# Patient Record
Sex: Female | Born: 1960 | Race: White | Hispanic: No | Marital: Married | Smoking: Never smoker
Health system: Southern US, Community
[De-identification: ages and names within clinical notes are randomized; demographics above are authoritative.]

## PROBLEM LIST (undated history)

## (undated) DIAGNOSIS — Z8742 Personal history of other diseases of the female genital tract: Secondary | ICD-10-CM

## (undated) DIAGNOSIS — E7800 Pure hypercholesterolemia, unspecified: Secondary | ICD-10-CM

## (undated) HISTORY — DX: Personal history of other diseases of the female genital tract: Z87.42

## (undated) HISTORY — DX: Pure hypercholesterolemia, unspecified: E78.00

---

## 1993-08-14 HISTORY — PX: PELVIC LAPAROSCOPY: SHX162

## 2000-02-21 ENCOUNTER — Other Ambulatory Visit: Admission: RE | Admit: 2000-02-21 | Discharge: 2000-02-21 | Payer: Self-pay | Admitting: Obstetrics and Gynecology

## 2001-02-21 ENCOUNTER — Other Ambulatory Visit: Admission: RE | Admit: 2001-02-21 | Discharge: 2001-02-21 | Payer: Self-pay | Admitting: Obstetrics and Gynecology

## 2002-06-04 ENCOUNTER — Encounter: Payer: Self-pay | Admitting: Obstetrics and Gynecology

## 2002-06-04 ENCOUNTER — Ambulatory Visit (HOSPITAL_COMMUNITY): Admission: RE | Admit: 2002-06-04 | Discharge: 2002-06-04 | Payer: Self-pay | Admitting: Obstetrics and Gynecology

## 2003-03-19 ENCOUNTER — Other Ambulatory Visit: Admission: RE | Admit: 2003-03-19 | Discharge: 2003-03-19 | Payer: Self-pay | Admitting: Obstetrics and Gynecology

## 2003-06-10 ENCOUNTER — Ambulatory Visit (HOSPITAL_COMMUNITY): Admission: RE | Admit: 2003-06-10 | Discharge: 2003-06-10 | Payer: Self-pay | Admitting: Obstetrics and Gynecology

## 2004-03-23 ENCOUNTER — Other Ambulatory Visit: Admission: RE | Admit: 2004-03-23 | Discharge: 2004-03-23 | Payer: Self-pay | Admitting: Obstetrics and Gynecology

## 2004-06-10 ENCOUNTER — Ambulatory Visit (HOSPITAL_COMMUNITY): Admission: RE | Admit: 2004-06-10 | Discharge: 2004-06-10 | Payer: Self-pay | Admitting: Obstetrics and Gynecology

## 2005-03-24 ENCOUNTER — Other Ambulatory Visit: Admission: RE | Admit: 2005-03-24 | Discharge: 2005-03-24 | Payer: Self-pay | Admitting: Obstetrics and Gynecology

## 2005-06-12 ENCOUNTER — Ambulatory Visit (HOSPITAL_COMMUNITY): Admission: RE | Admit: 2005-06-12 | Discharge: 2005-06-12 | Payer: Self-pay | Admitting: Obstetrics and Gynecology

## 2006-04-12 ENCOUNTER — Other Ambulatory Visit: Admission: RE | Admit: 2006-04-12 | Discharge: 2006-04-12 | Payer: Self-pay | Admitting: Obstetrics and Gynecology

## 2006-06-13 ENCOUNTER — Ambulatory Visit (HOSPITAL_COMMUNITY): Admission: RE | Admit: 2006-06-13 | Discharge: 2006-06-13 | Payer: Self-pay | Admitting: Obstetrics and Gynecology

## 2006-06-13 IMAGING — MG MM DIGITAL SCREENING BILAT
4 series · 4 of 4 positions shown · non-contrast
Comparison: none

DG SCREEN MAMMOGRAM BILATERAL
Bilateral CC and MLO view(s) were taken.

DIGITAL SCREENING MAMMOGRAM WITH CAD:
There is a fibroglandular pattern.  No masses or malignant type calcifications are identified.  
Compared with prior studies.

[R CC]
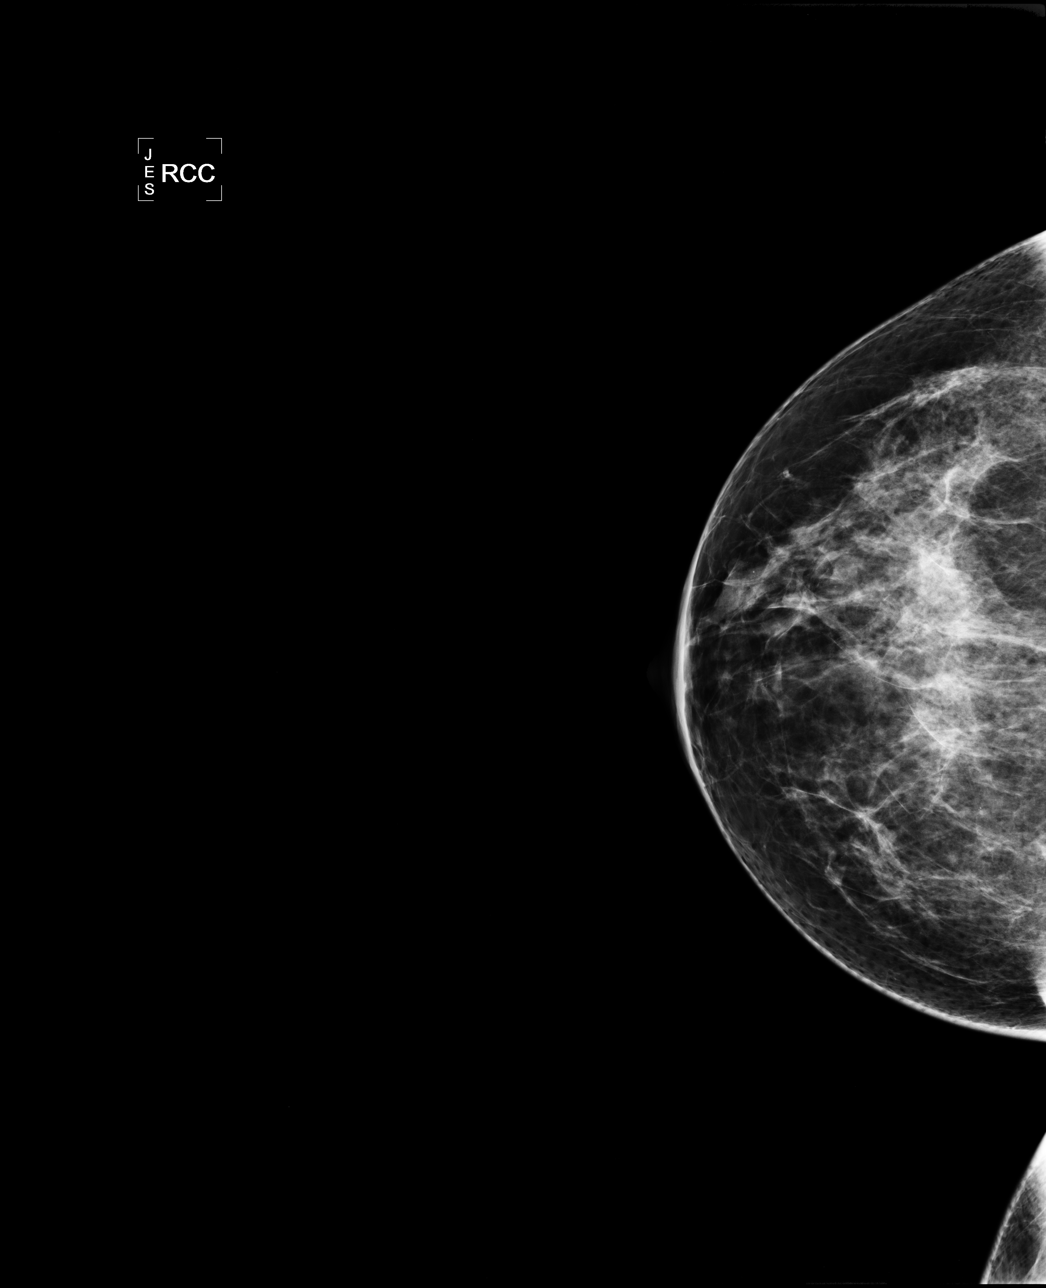

[R MLO]
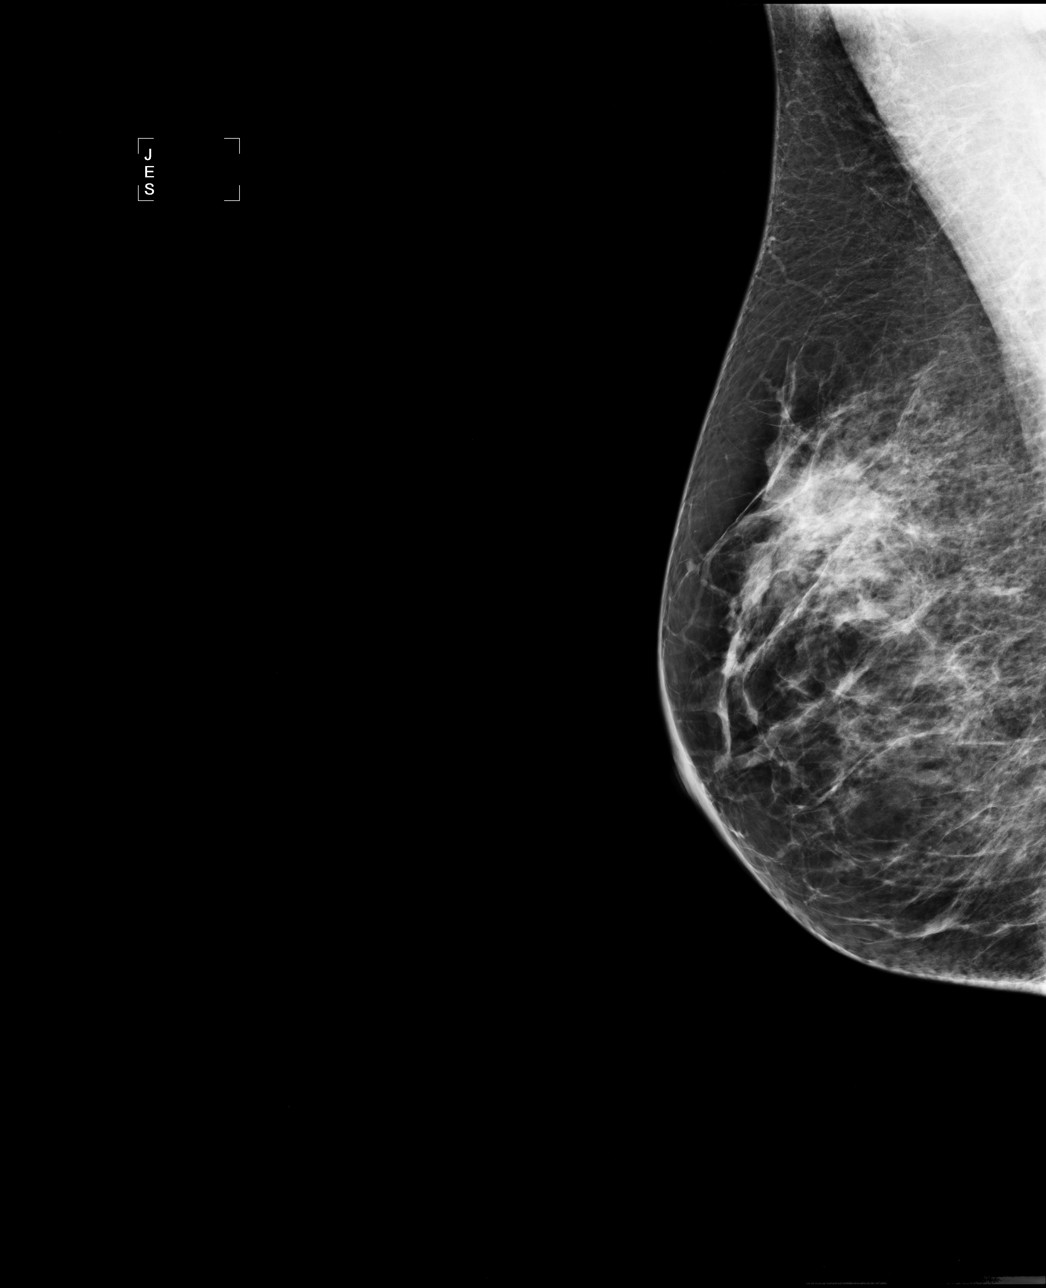

[L CC]
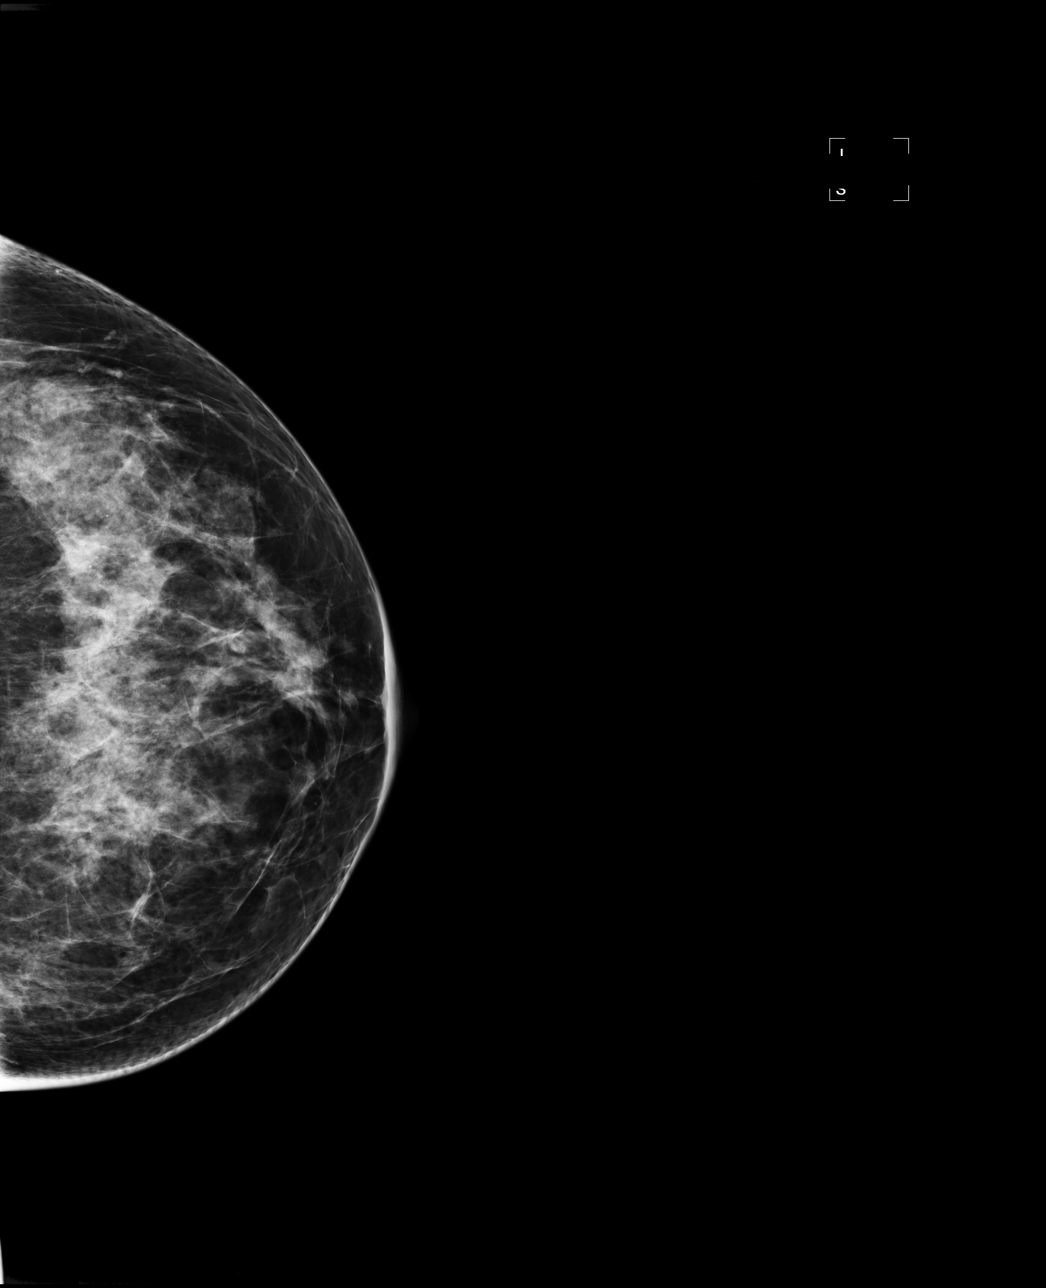

[L MLO]
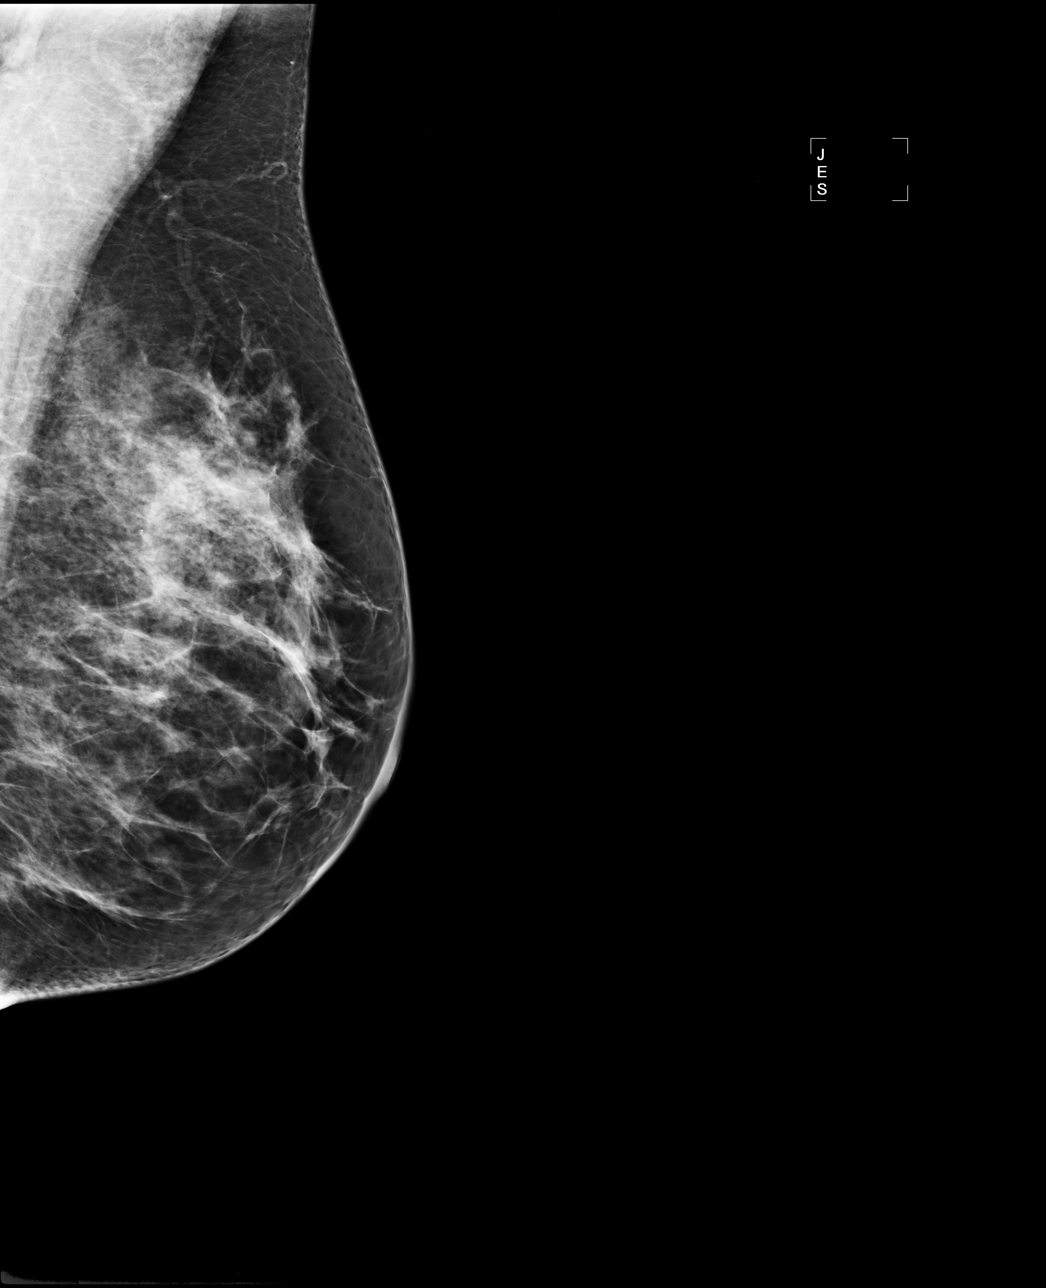

[4 of 4 positions shown; findings below may reference images not displayed]

IMPRESSION: No specific mammographic evidence of malignancy.  Next screening mammogram is recommended in one 
year.

ASSESSMENT: Negative - BI-RADS 1

Screening mammogram in 1 year.
ANALYZED BY COMPUTER AIDED DETECTION. , THIS PROCEDURE WAS A DIGITAL MAMMOGRAM.

## 2007-04-22 ENCOUNTER — Other Ambulatory Visit: Admission: RE | Admit: 2007-04-22 | Discharge: 2007-04-22 | Payer: Self-pay | Admitting: Obstetrics and Gynecology

## 2007-06-17 ENCOUNTER — Ambulatory Visit (HOSPITAL_COMMUNITY): Admission: RE | Admit: 2007-06-17 | Discharge: 2007-06-17 | Payer: Self-pay | Admitting: Obstetrics and Gynecology

## 2007-06-17 IMAGING — MG MM DIGITAL SCREENING BILAT
4 series · 4 of 4 positions shown · non-contrast
Comparison: Prior studies.

DG SCREEN MAMMOGRAM BILATERAL
Bilateral CC and MLO view(s) were taken.
Prior study comparison: [DATE], bilateral screening mammogram.

DIGITAL SCREENING MAMMOGRAM WITH CAD:

[R CC]
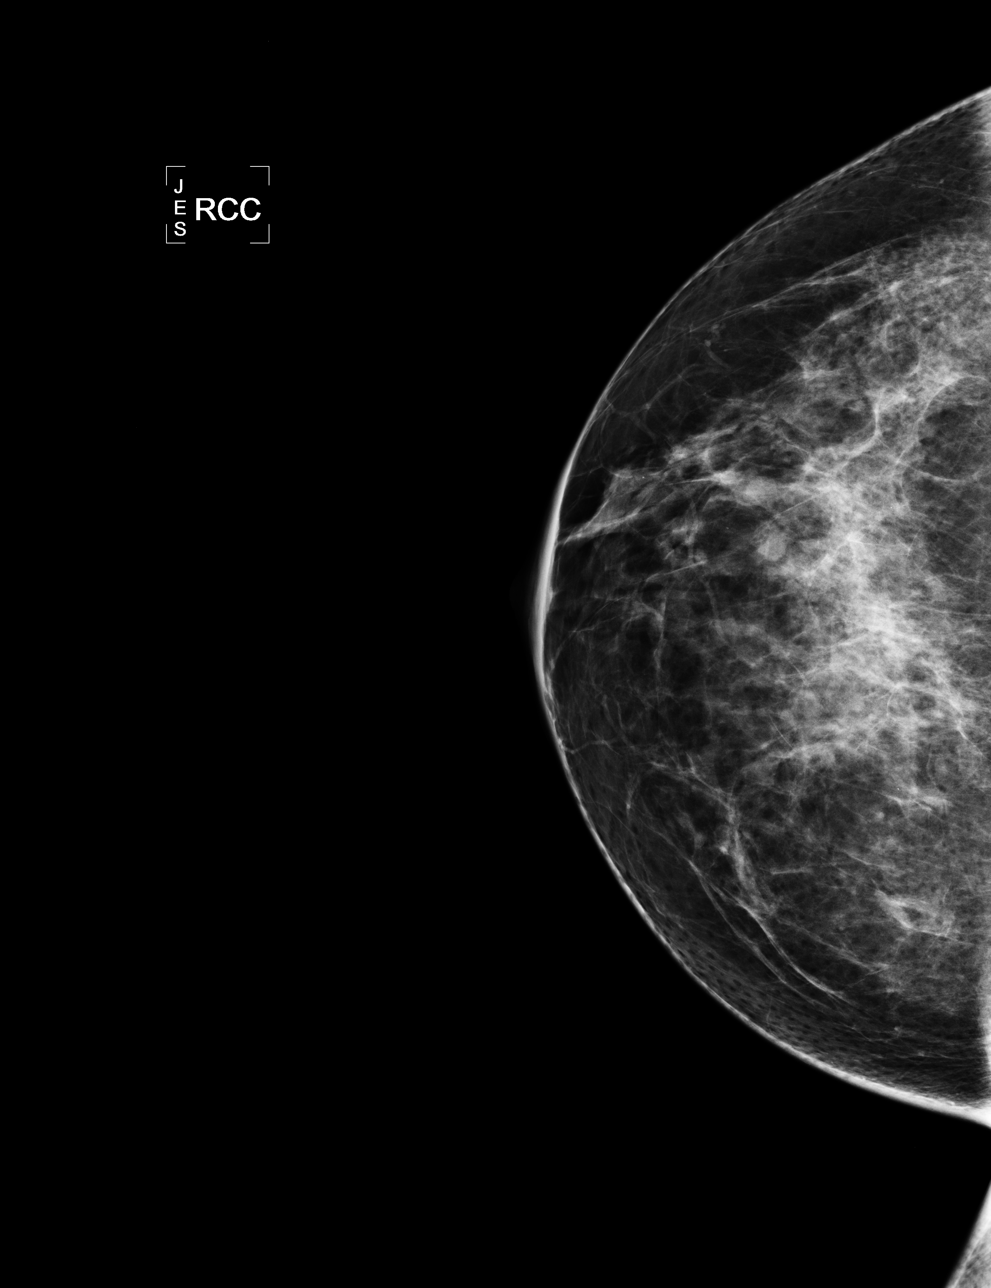

[R MLO]
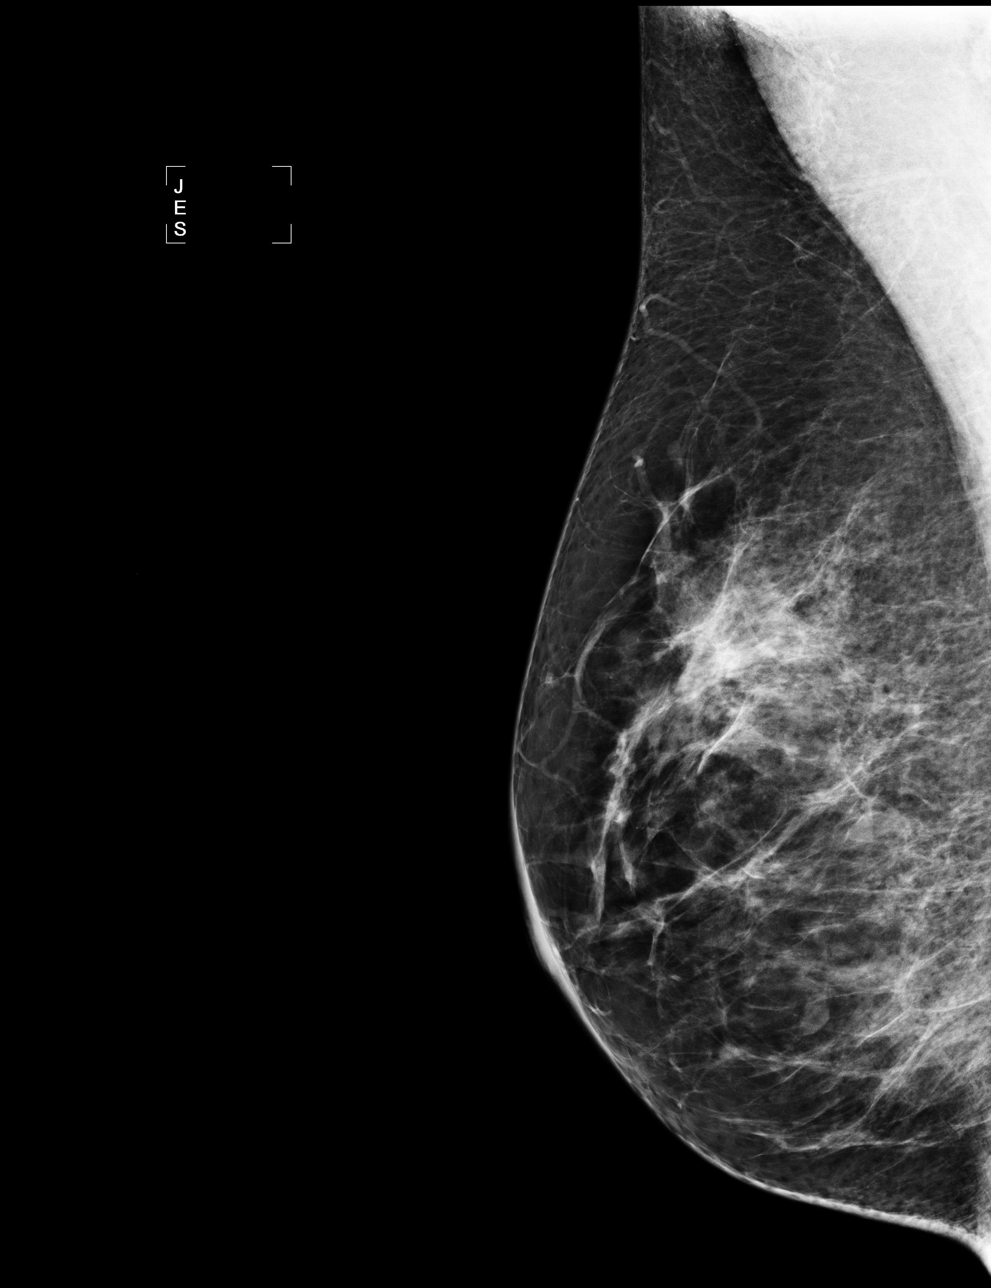

[L CC]
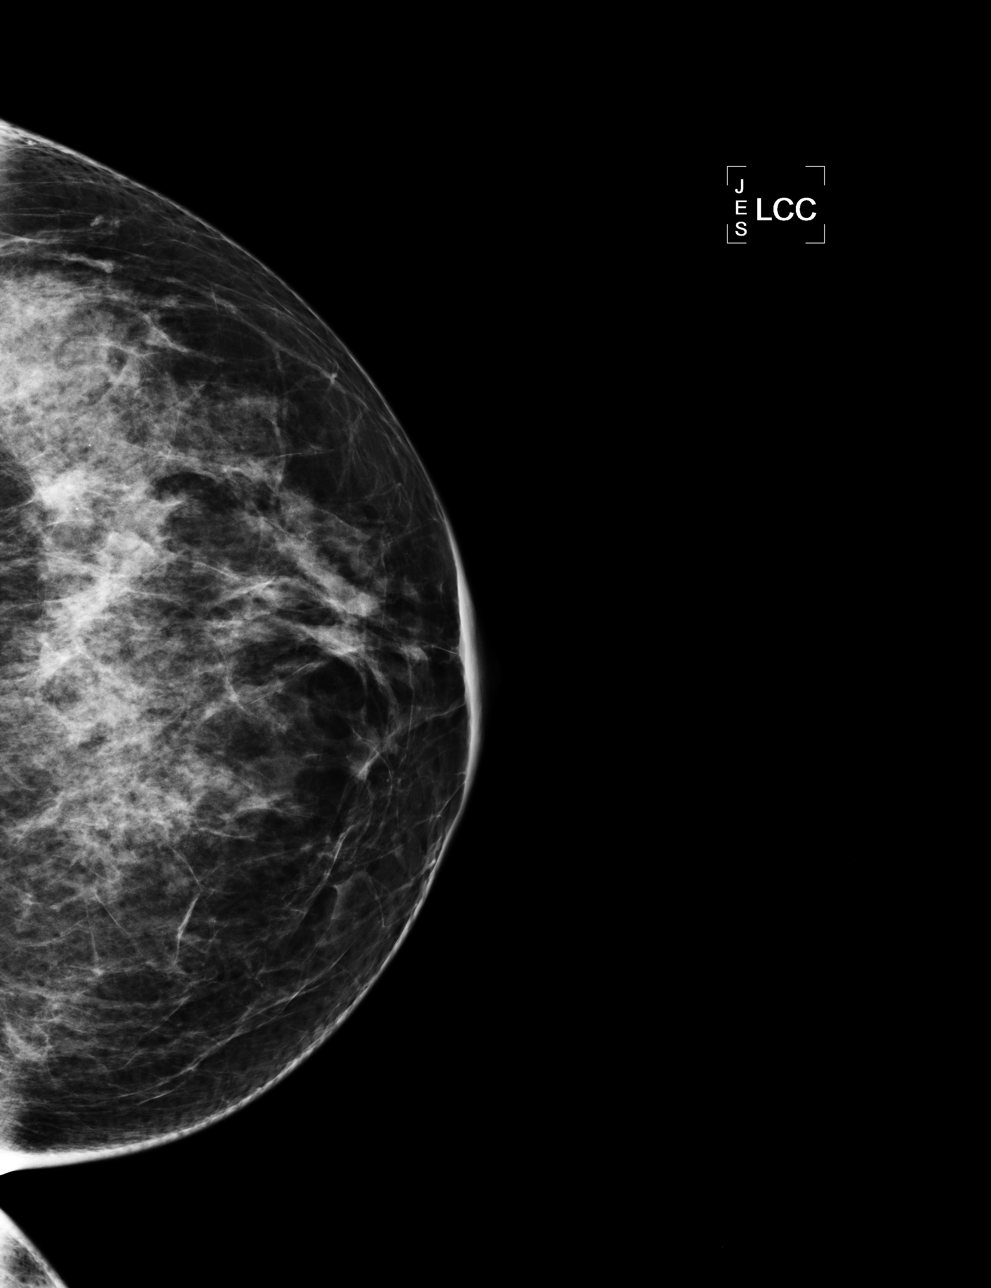

[L MLO]
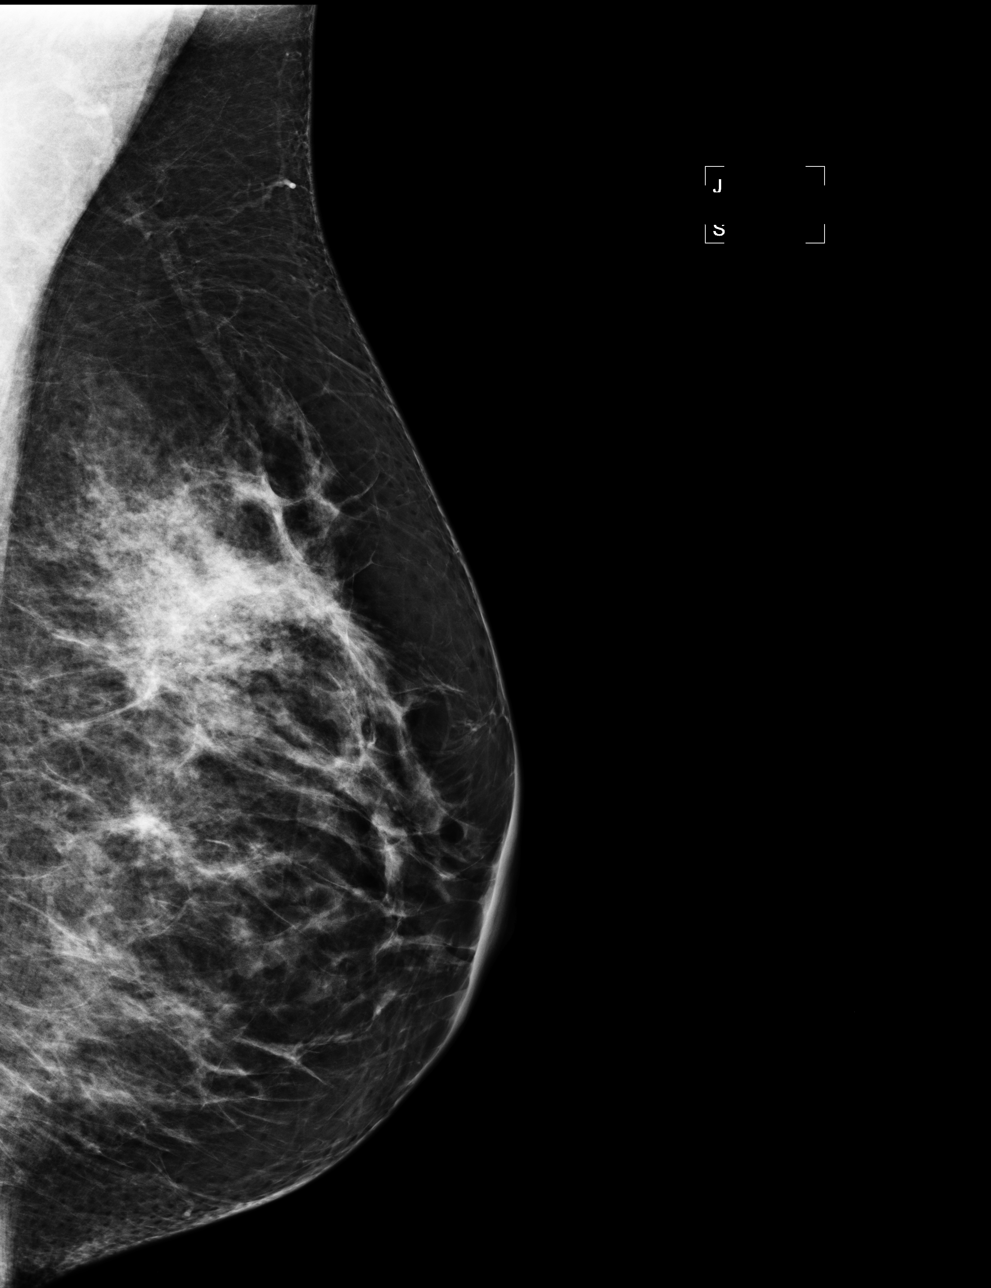

[4 of 4 positions shown; findings below may reference images not displayed]

The breast tissue is heterogeneously dense.  A possible mass is noted in the left breast.  Spot 
compression views and possibly sonography are recommended for further evaluation.  The right breast
is unremarkable.
IMPRESSION: Possible mass, left breast.  Additional evaluation is indicated.  The patient will be contacted for
additional studies and a supplemental report will follow.

ASSESSMENT: Need additional imaging evaluation and/or prior mammograms for comparison - BI-RADS 0 -
Left

Further imaging of the left breast.
ANALYZED BY COMPUTER AIDED DETECTION. , THIS PROCEDURE WAS A DIGITAL MAMMOGRAM.

## 2007-06-21 ENCOUNTER — Encounter: Admission: RE | Admit: 2007-06-21 | Discharge: 2007-06-21 | Payer: Self-pay | Admitting: Obstetrics and Gynecology

## 2007-06-21 IMAGING — MG MM DIAGNOSTIC LTD LEFT
3 series · 3 of 3 positions shown · non-contrast
Comparison: Previous examinations.

[REDACTED] LEFT
CC and MLO view(s) were taken of the left breast.

DIGITAL LIMITED LEFT DIAGNOSTIC MAMMOGRAM WITH CAD:
CLINICAL DATA: Possible left breast mass at recent screening mammography.

[L CC]
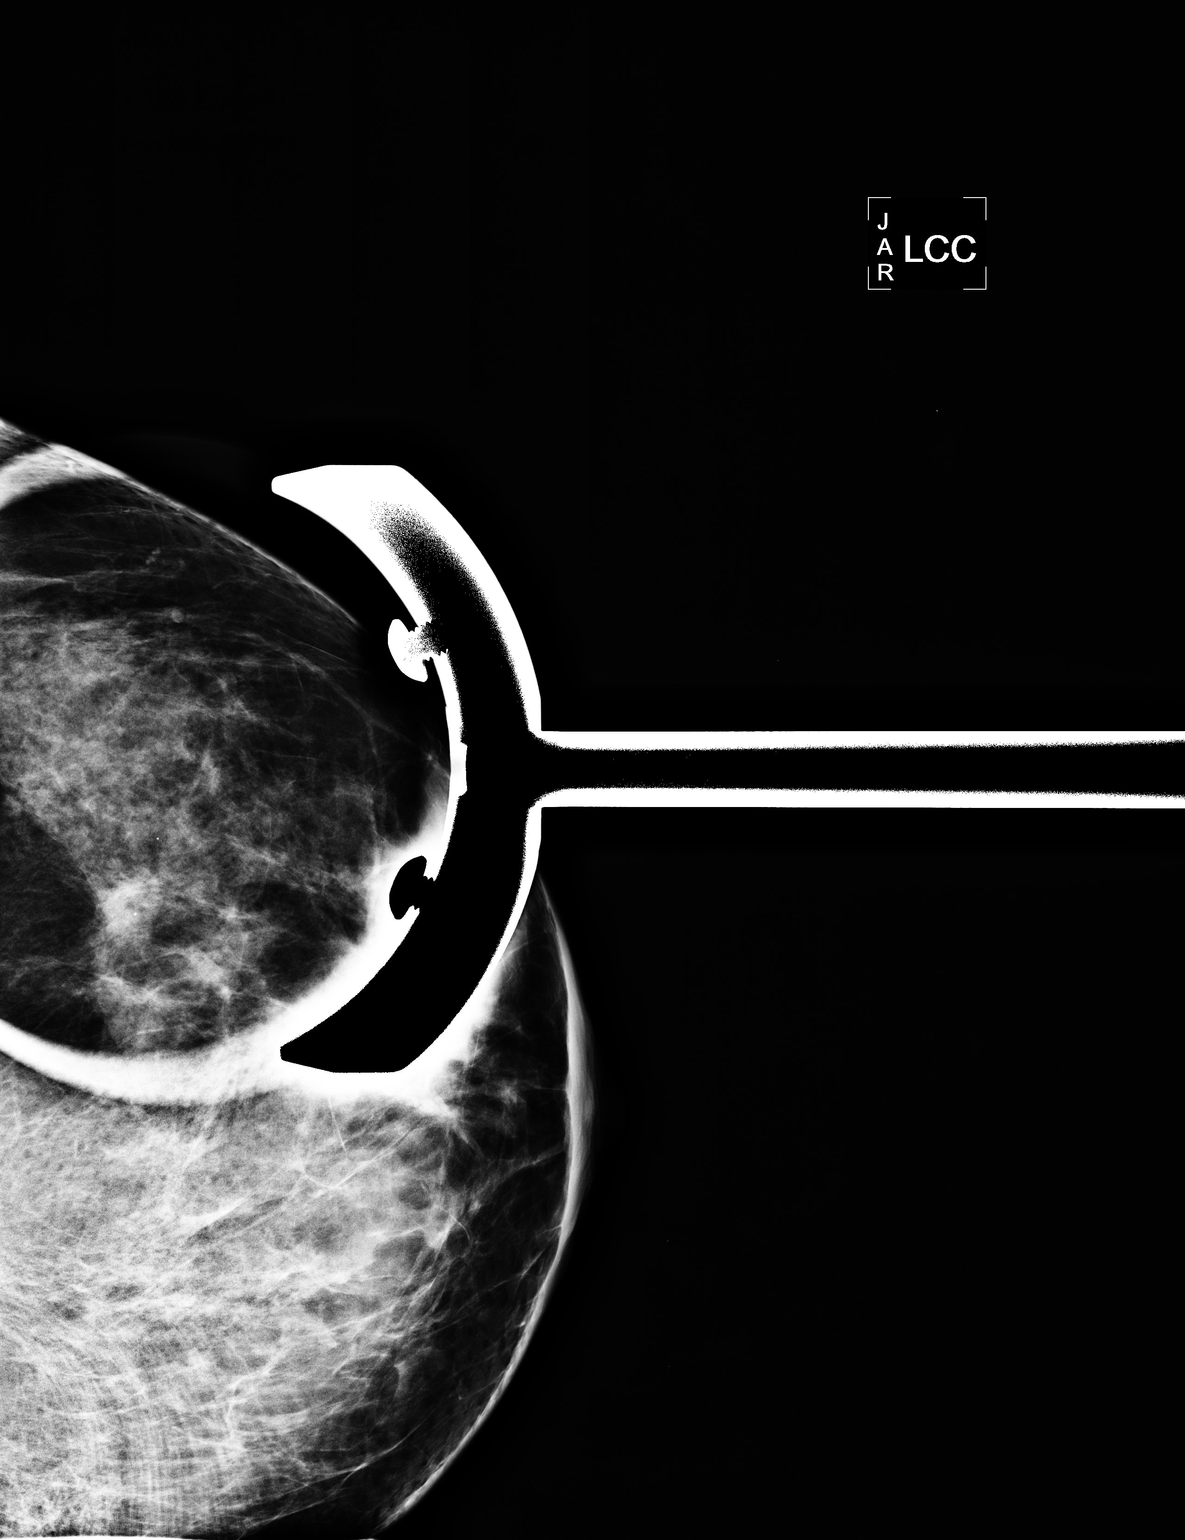

[L MLO]
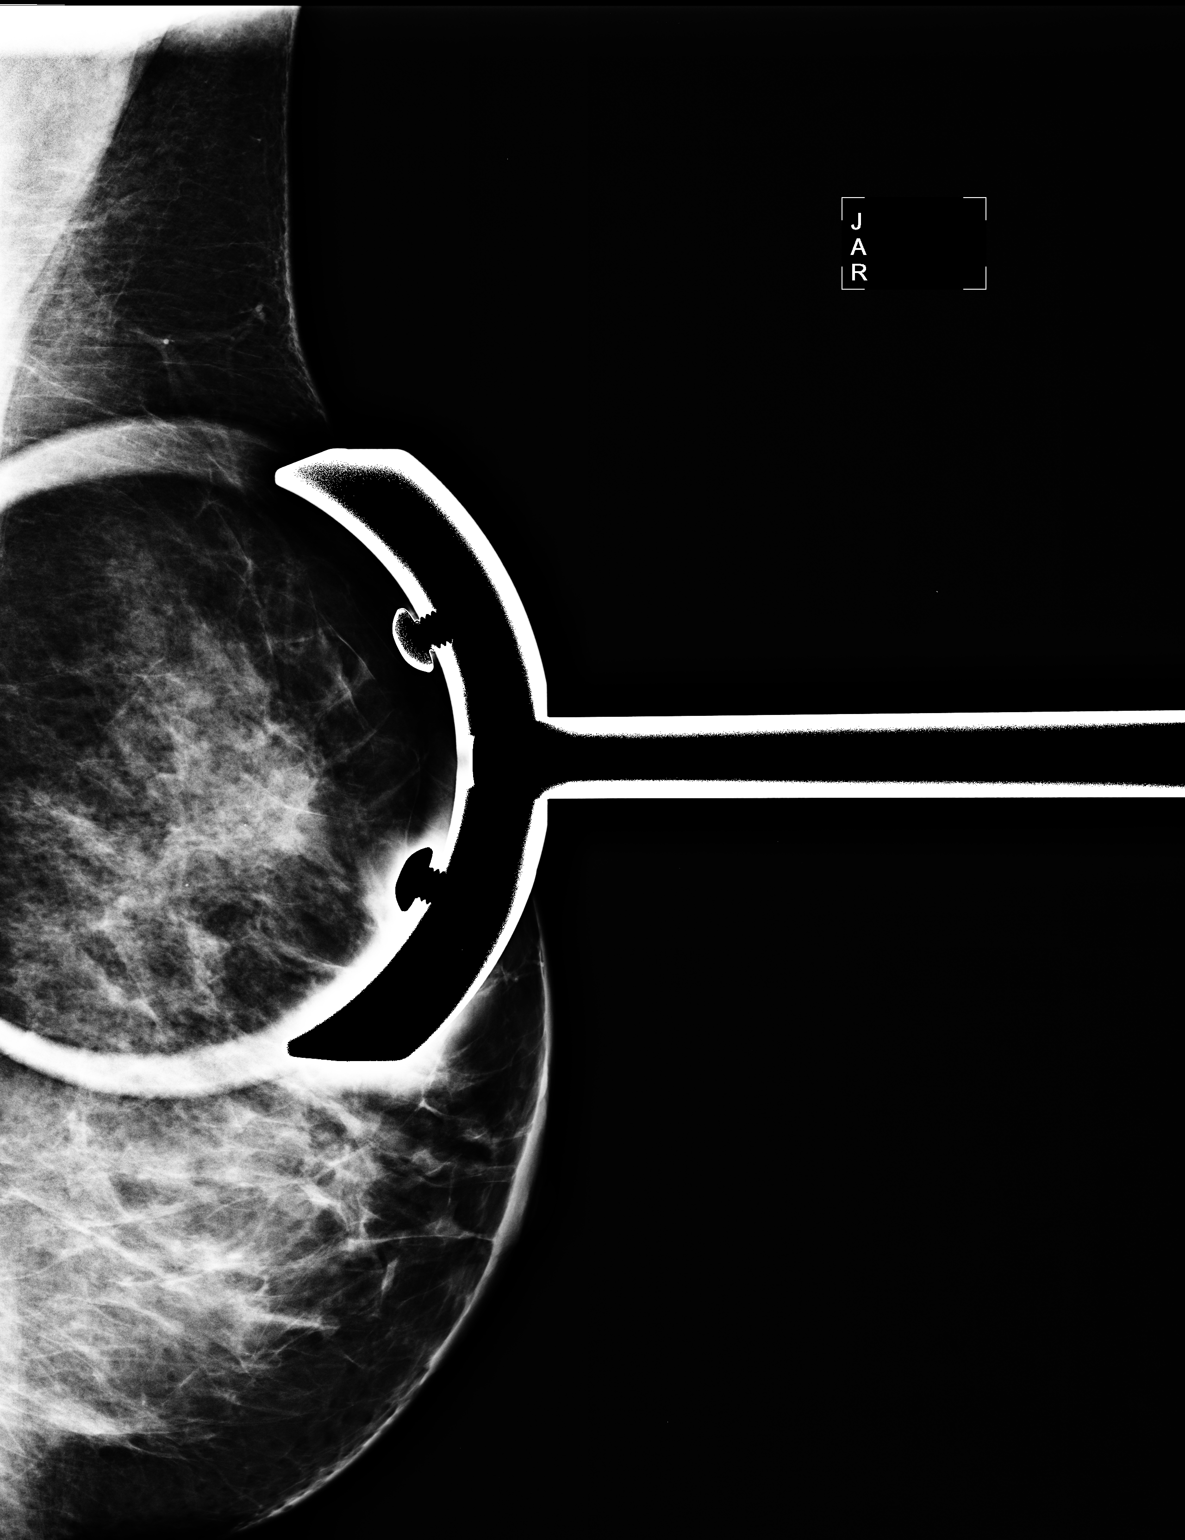

[L ML]
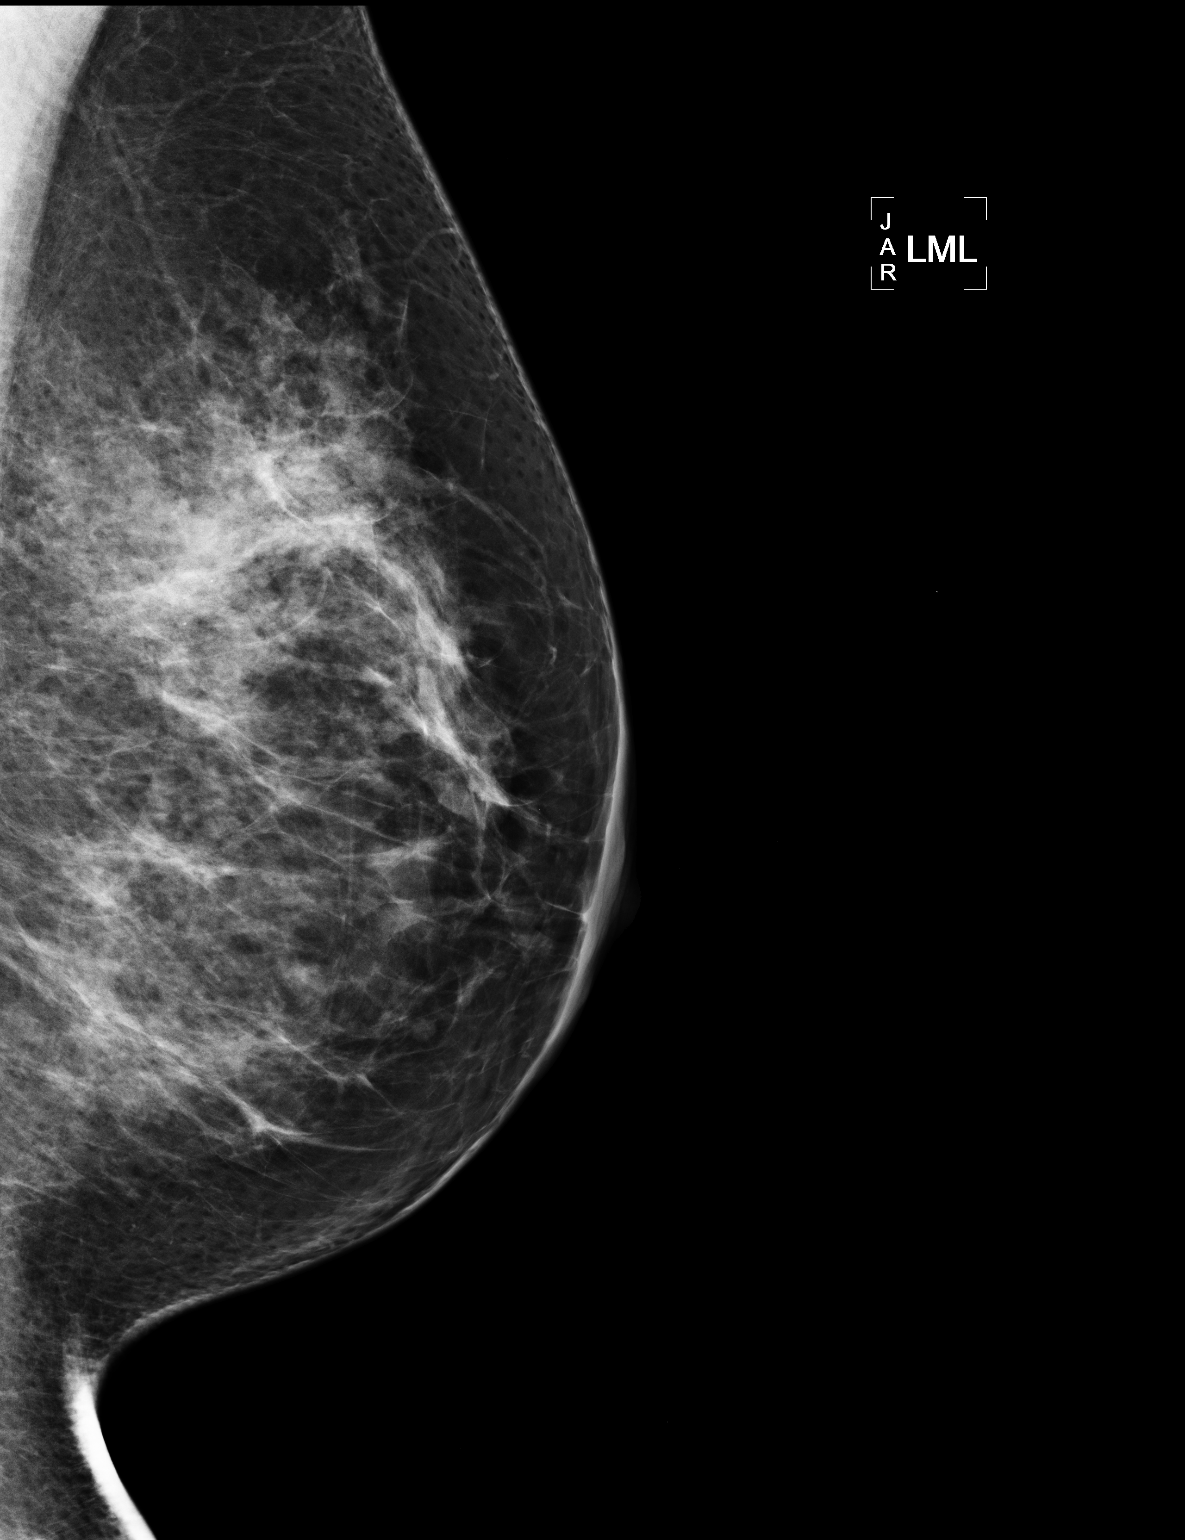

[3 of 3 positions shown; findings below may reference images not displayed]

True lateral and spot compression views of the left breast demonstrate stable normal-appearing 
heterogeneously dense parenchyma in the breast.  No mass or other findings suspicious for 
malignancy are seen.
IMPRESSION: No evidence of malignancy.  The recently suspected left breast mass represented overlapping of 
normal tissue.  Screening mammography is recommended in one year.

ASSESSMENT: Negative - BI-RADS 1

Screening mammogram of both breasts in 1 year.
ANALYZED BY COMPUTER AIDED DETECTION. , THIS PROCEDURE WAS A DIGITAL MAMMOGRAM.

## 2008-05-07 ENCOUNTER — Encounter: Payer: Self-pay | Admitting: Obstetrics and Gynecology

## 2008-05-07 ENCOUNTER — Other Ambulatory Visit: Admission: RE | Admit: 2008-05-07 | Discharge: 2008-05-07 | Payer: Self-pay | Admitting: Obstetrics and Gynecology

## 2008-05-07 ENCOUNTER — Ambulatory Visit: Payer: Self-pay | Admitting: Obstetrics and Gynecology

## 2008-06-24 ENCOUNTER — Ambulatory Visit (HOSPITAL_COMMUNITY): Admission: RE | Admit: 2008-06-24 | Discharge: 2008-06-24 | Payer: Self-pay | Admitting: Obstetrics and Gynecology

## 2008-06-24 IMAGING — MG MM DIGITAL SCREENING BILAT
6 series · 6 of 6 positions shown · non-contrast
Comparison: none

DG SCREEN MAMMOGRAM BILATERAL
Bilateral CC and MLO view(s) were taken.
Technologist: PAMCITA.(PAMCITA)(M)

DIGITAL SCREENING MAMMOGRAM WITH CAD:
The breast tissue is heterogeneously dense.  No masses or malignant type calcifications are 
identified.  Compared with prior studies.

[R CC]
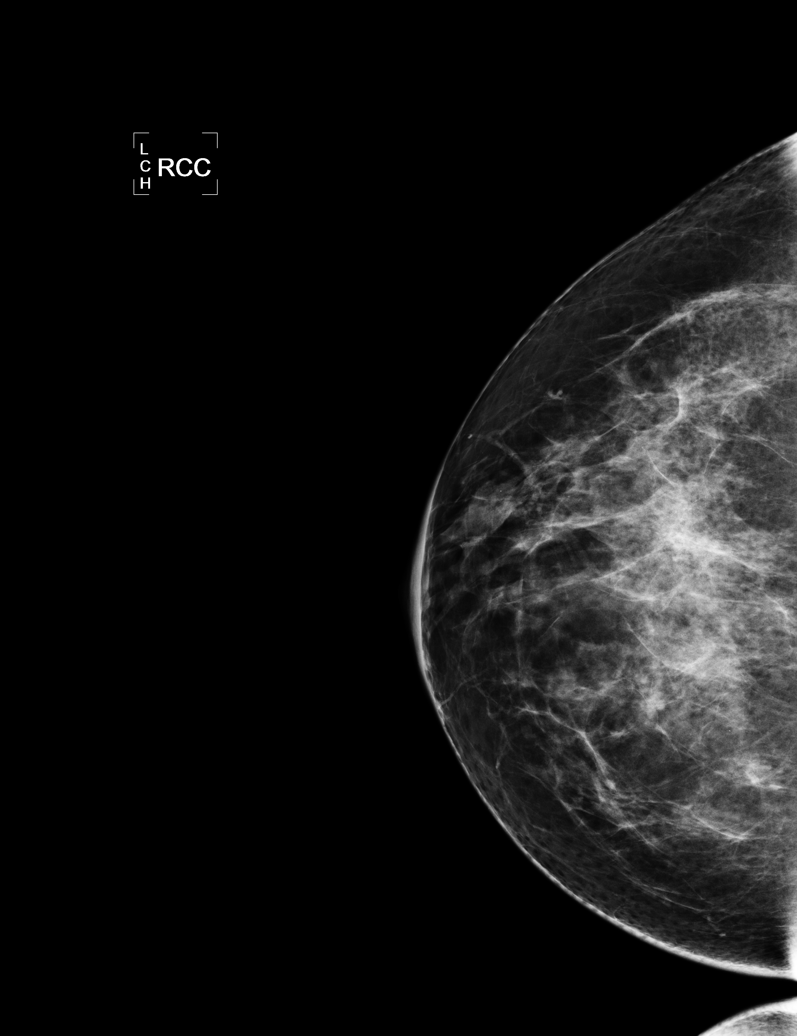

[R MLO (1 of 2)]
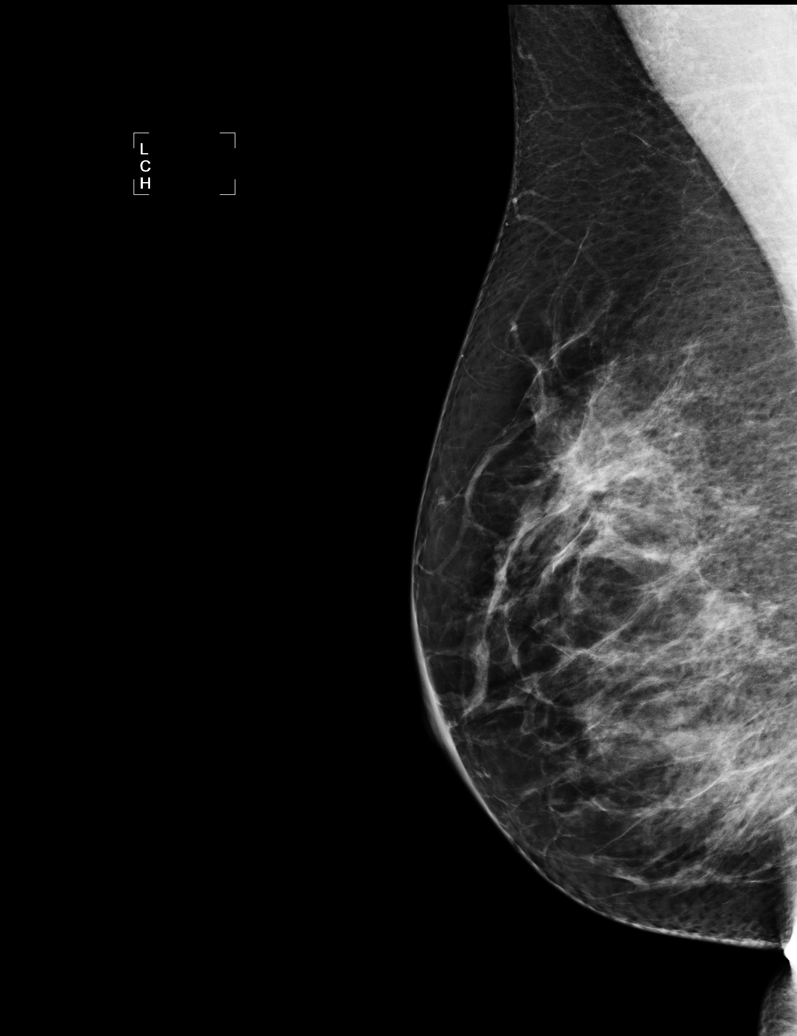

[L CC]
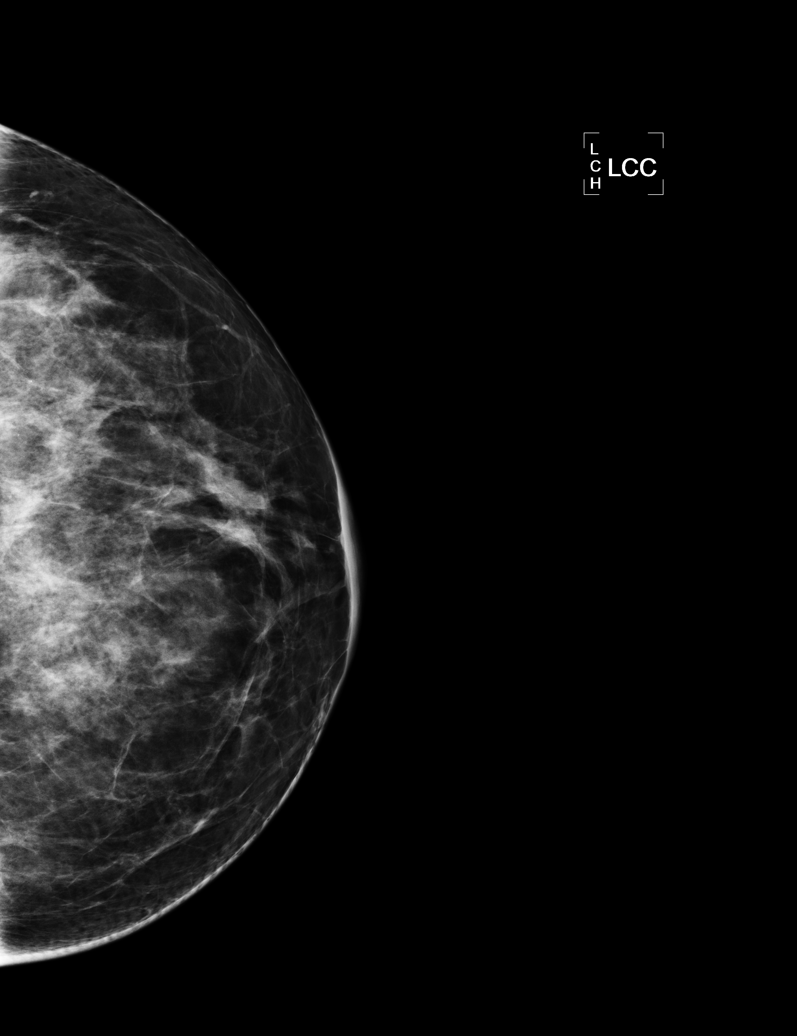

[L MLO]
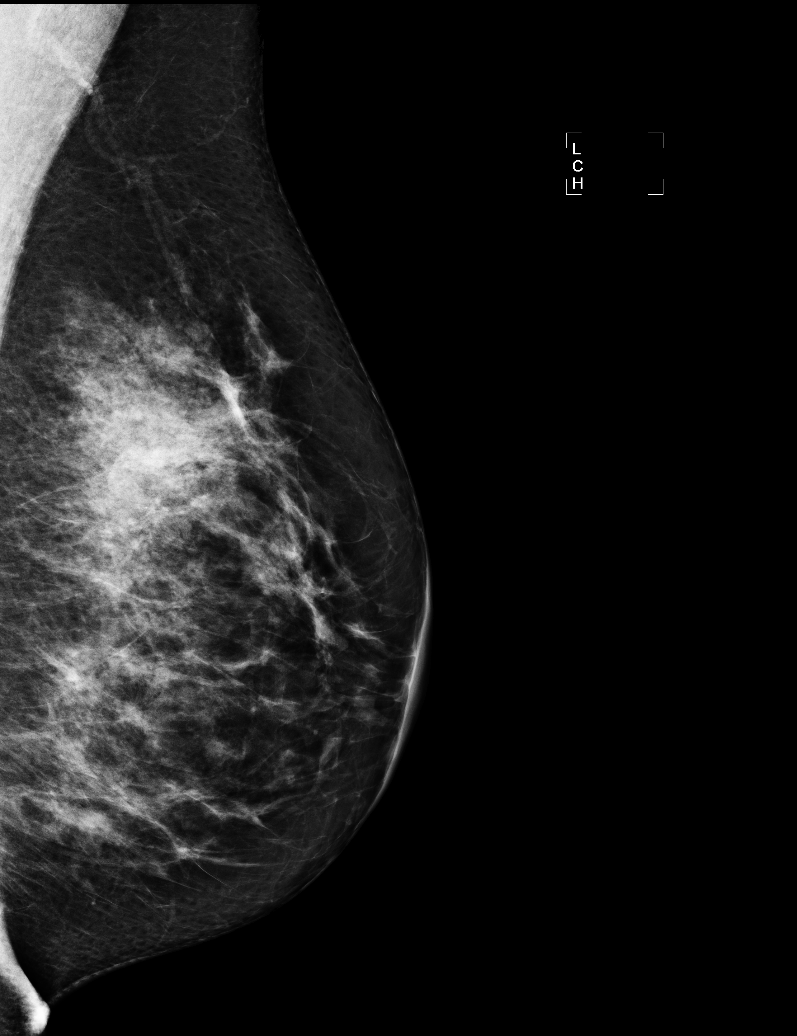

[R MLO (2 of 2)]
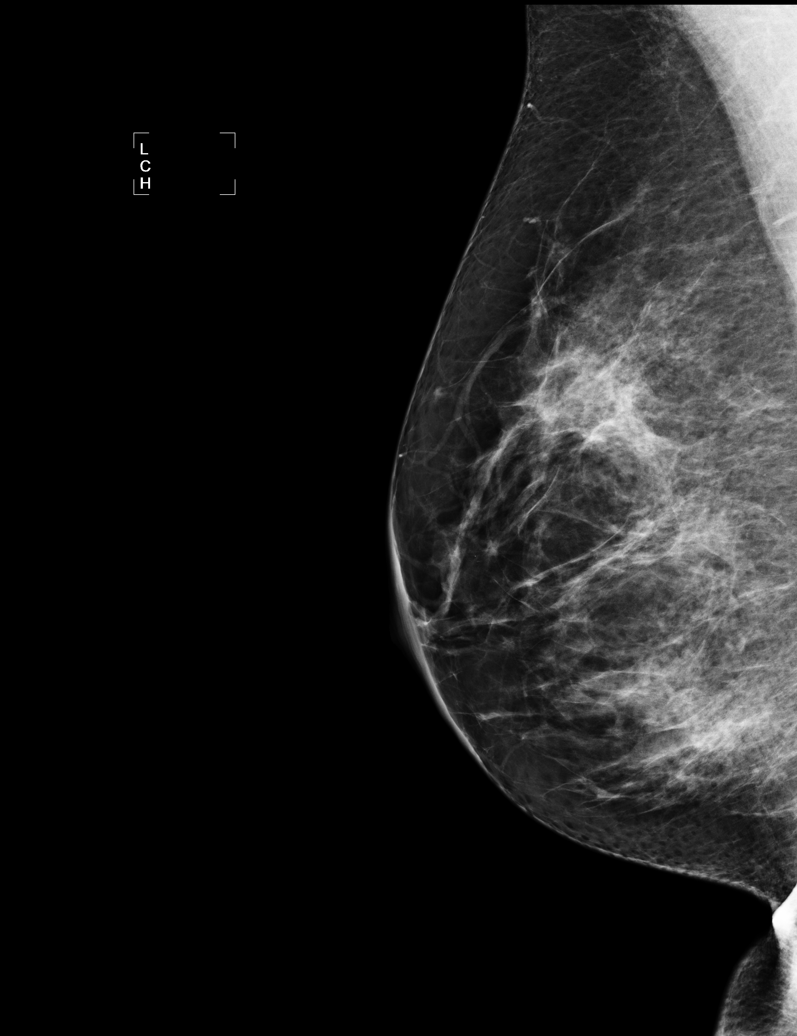

[L XCCL]
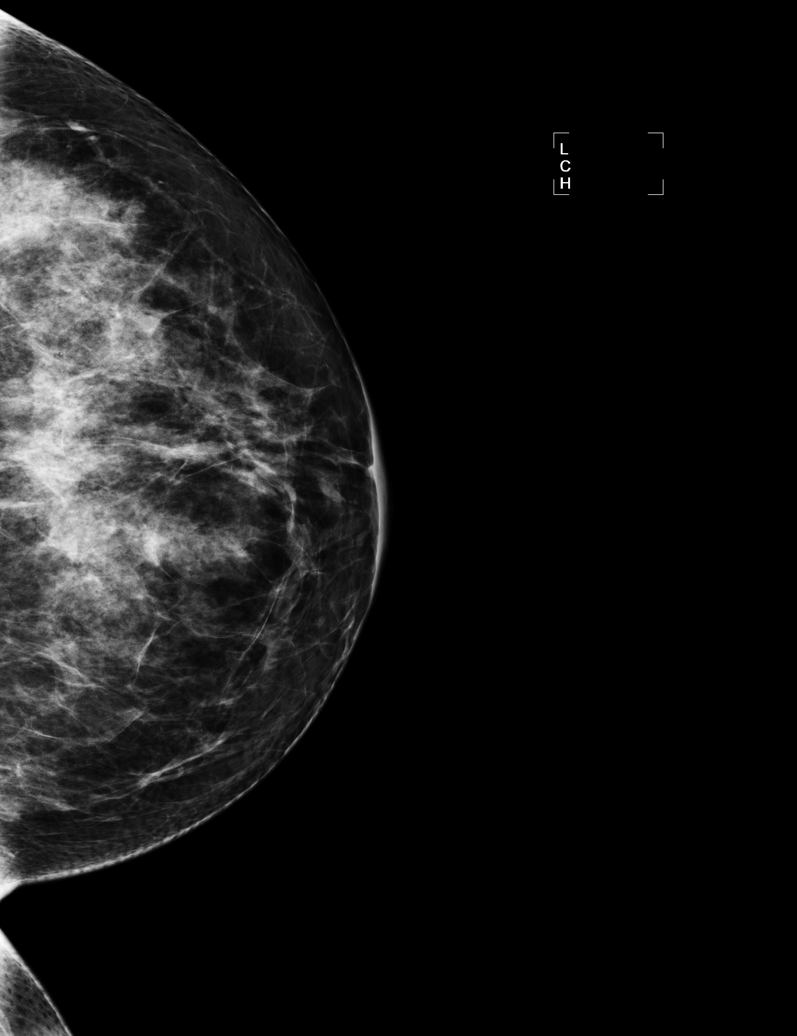

[6 of 6 positions shown; findings below may reference images not displayed]

IMPRESSION: No specific mammographic evidence of malignancy.  Next screening mammogram is recommended in one 
year.

ASSESSMENT: Negative - BI-RADS 1

Screening mammogram in 1 year.
ANALYZED BY COMPUTER AIDED DETECTION. , THIS PROCEDURE WAS A DIGITAL MAMMOGRAM.

## 2009-05-17 ENCOUNTER — Encounter: Payer: Self-pay | Admitting: Obstetrics and Gynecology

## 2009-05-17 ENCOUNTER — Other Ambulatory Visit: Admission: RE | Admit: 2009-05-17 | Discharge: 2009-05-17 | Payer: Self-pay | Admitting: Obstetrics and Gynecology

## 2009-05-17 ENCOUNTER — Ambulatory Visit: Payer: Self-pay | Admitting: Obstetrics and Gynecology

## 2009-05-19 ENCOUNTER — Ambulatory Visit: Payer: Self-pay | Admitting: Obstetrics and Gynecology

## 2009-06-25 ENCOUNTER — Ambulatory Visit (HOSPITAL_COMMUNITY): Admission: RE | Admit: 2009-06-25 | Discharge: 2009-06-25 | Payer: Self-pay | Admitting: Obstetrics and Gynecology

## 2009-06-25 IMAGING — MG MM DIGITAL SCREENING BILAT
4 series · 4 of 4 positions shown · non-contrast
Comparison: none

DG SCREEN MAMMOGRAM BILATERAL
Bilateral CC and MLO view(s) were taken.
Technologist: PADAM, RT RM

DIGITAL SCREENING MAMMOGRAM WITH CAD:
The breast tissue is heterogeneously dense.  No masses or malignant type calcifications are 
identified.  Compared with prior studies.
Images were processed with CAD.

[R CC]
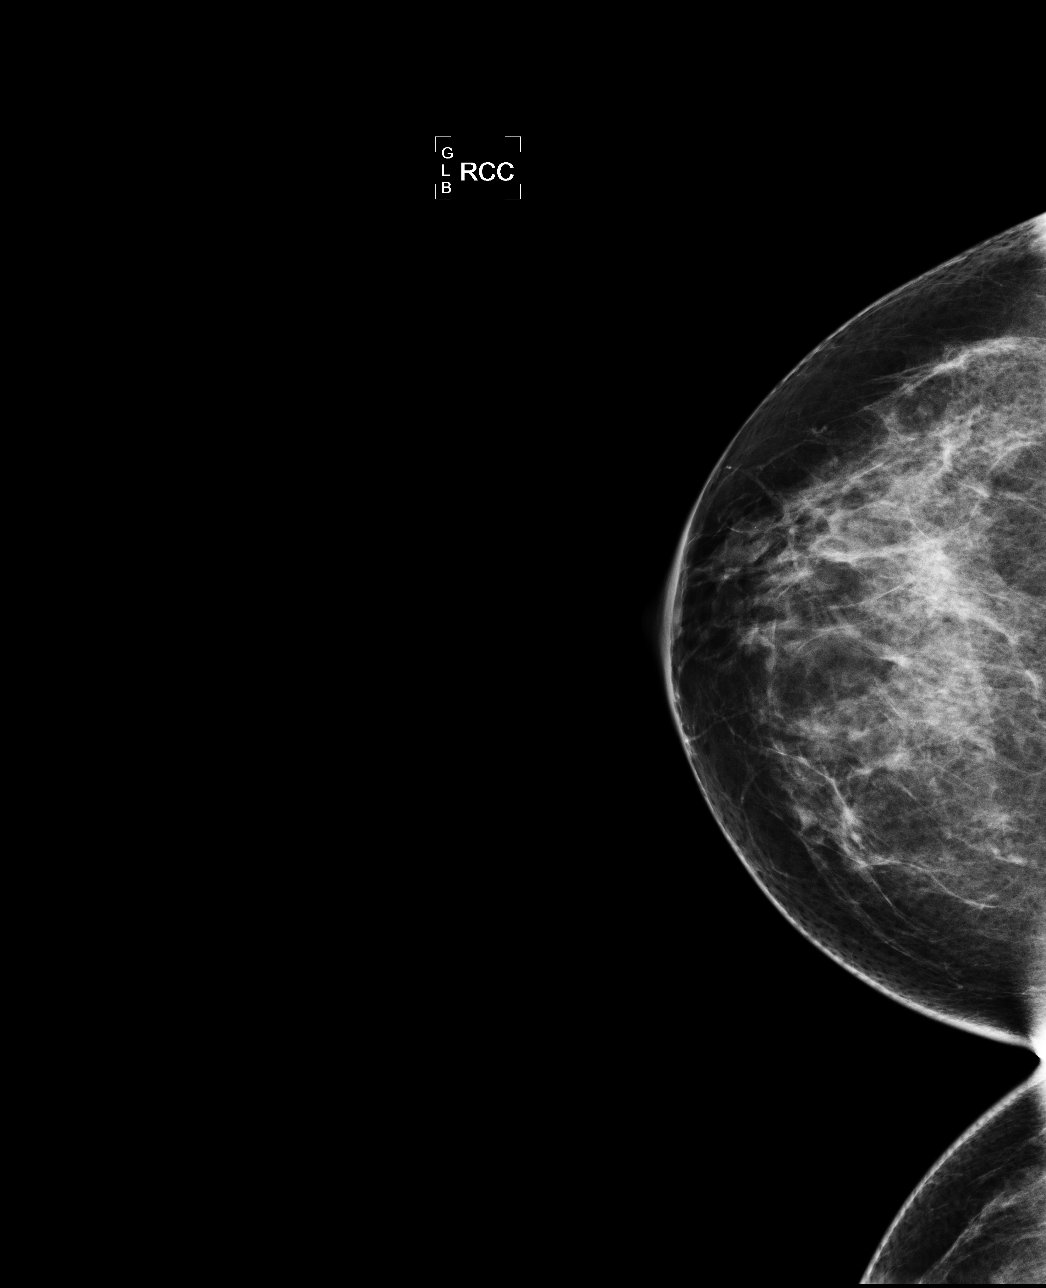

[R MLO]
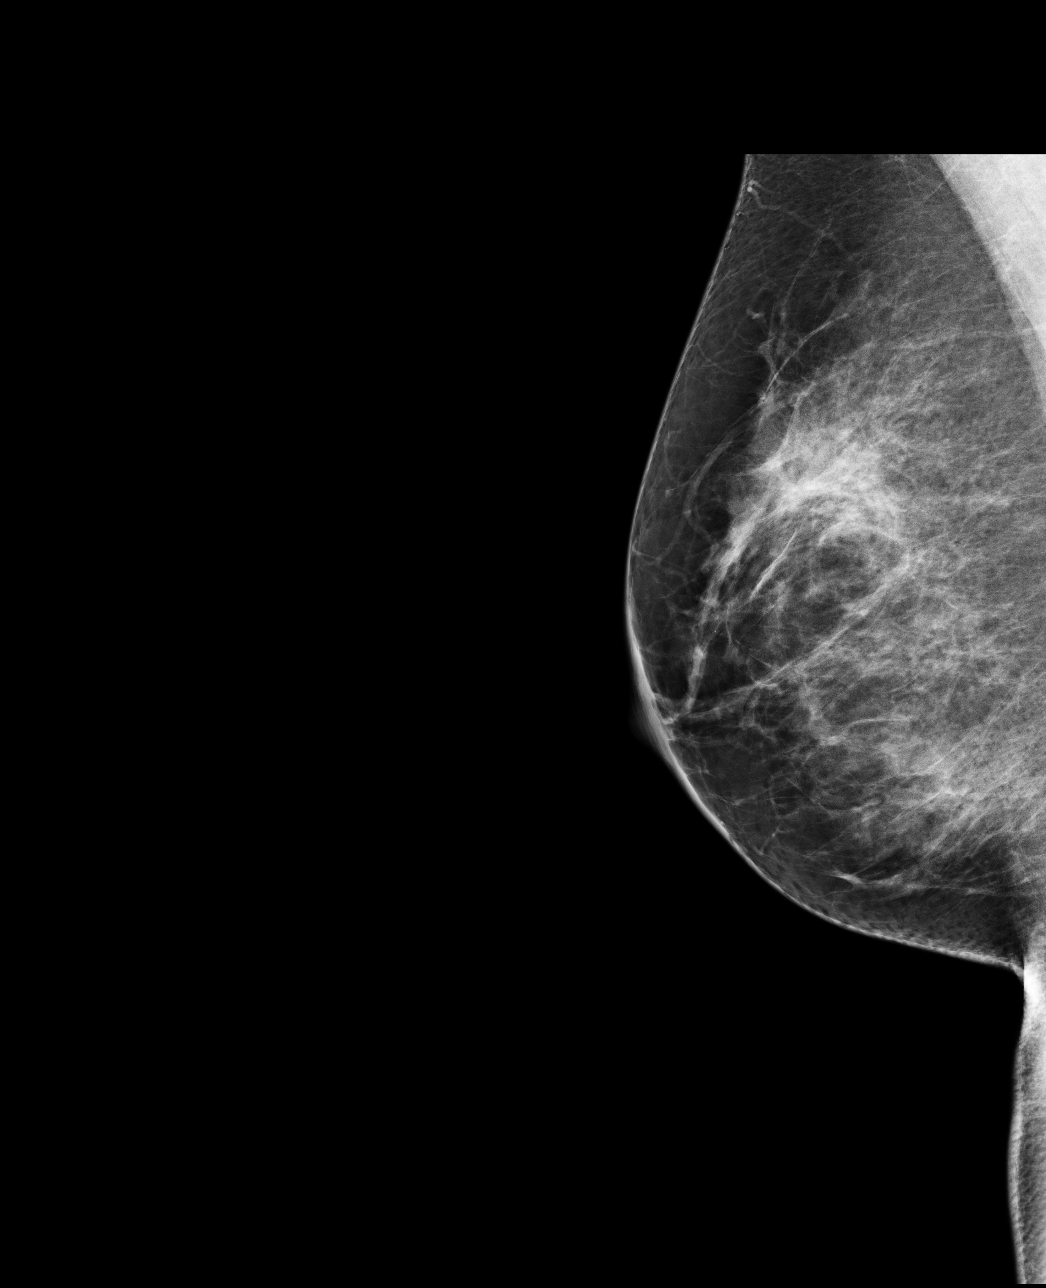

[L CC]
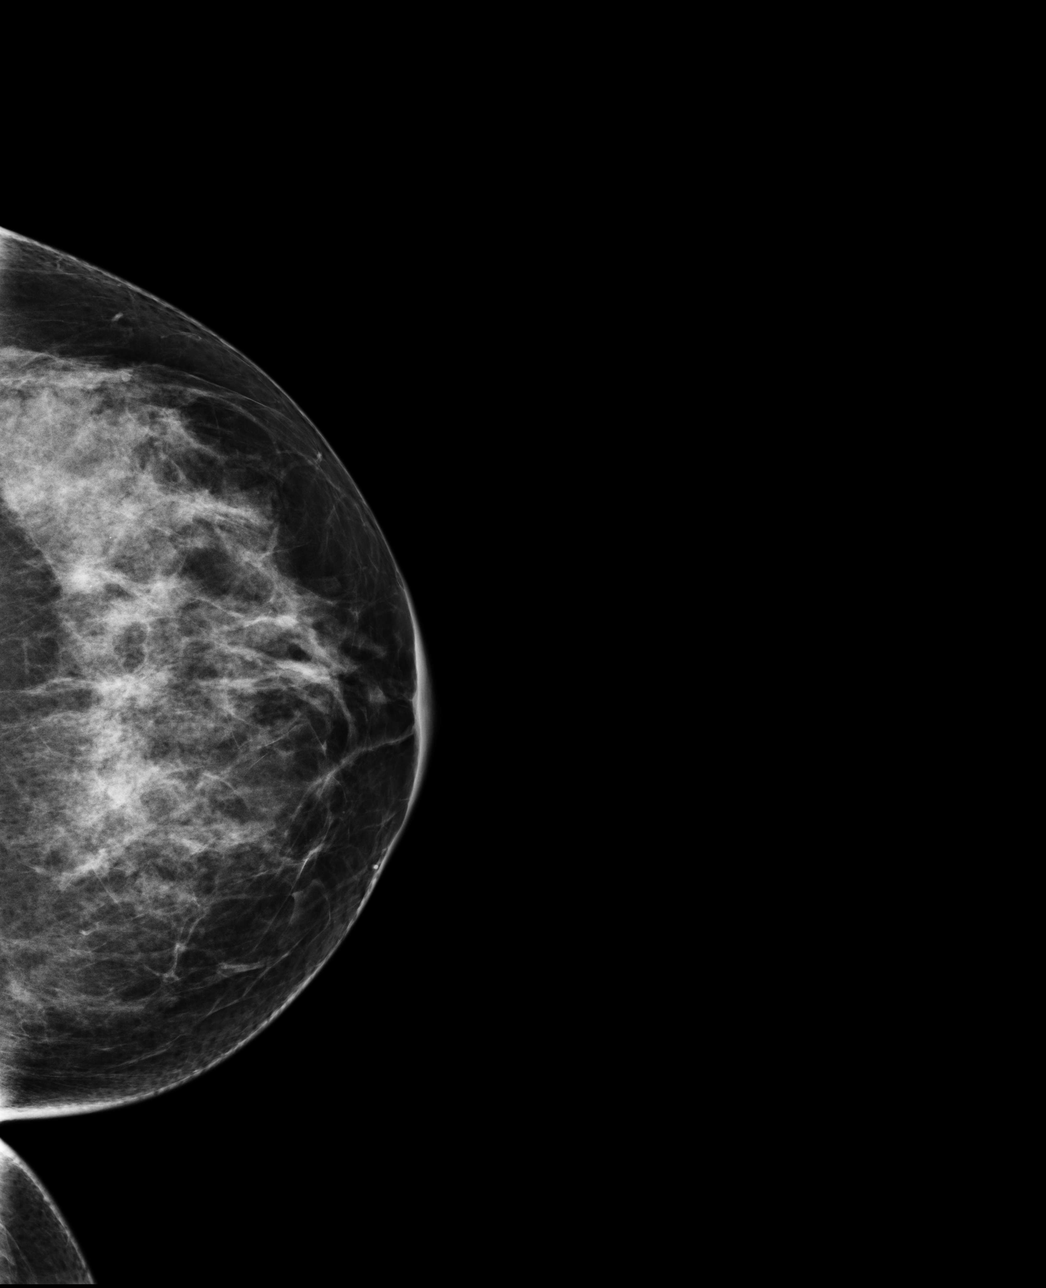

[L MLO]
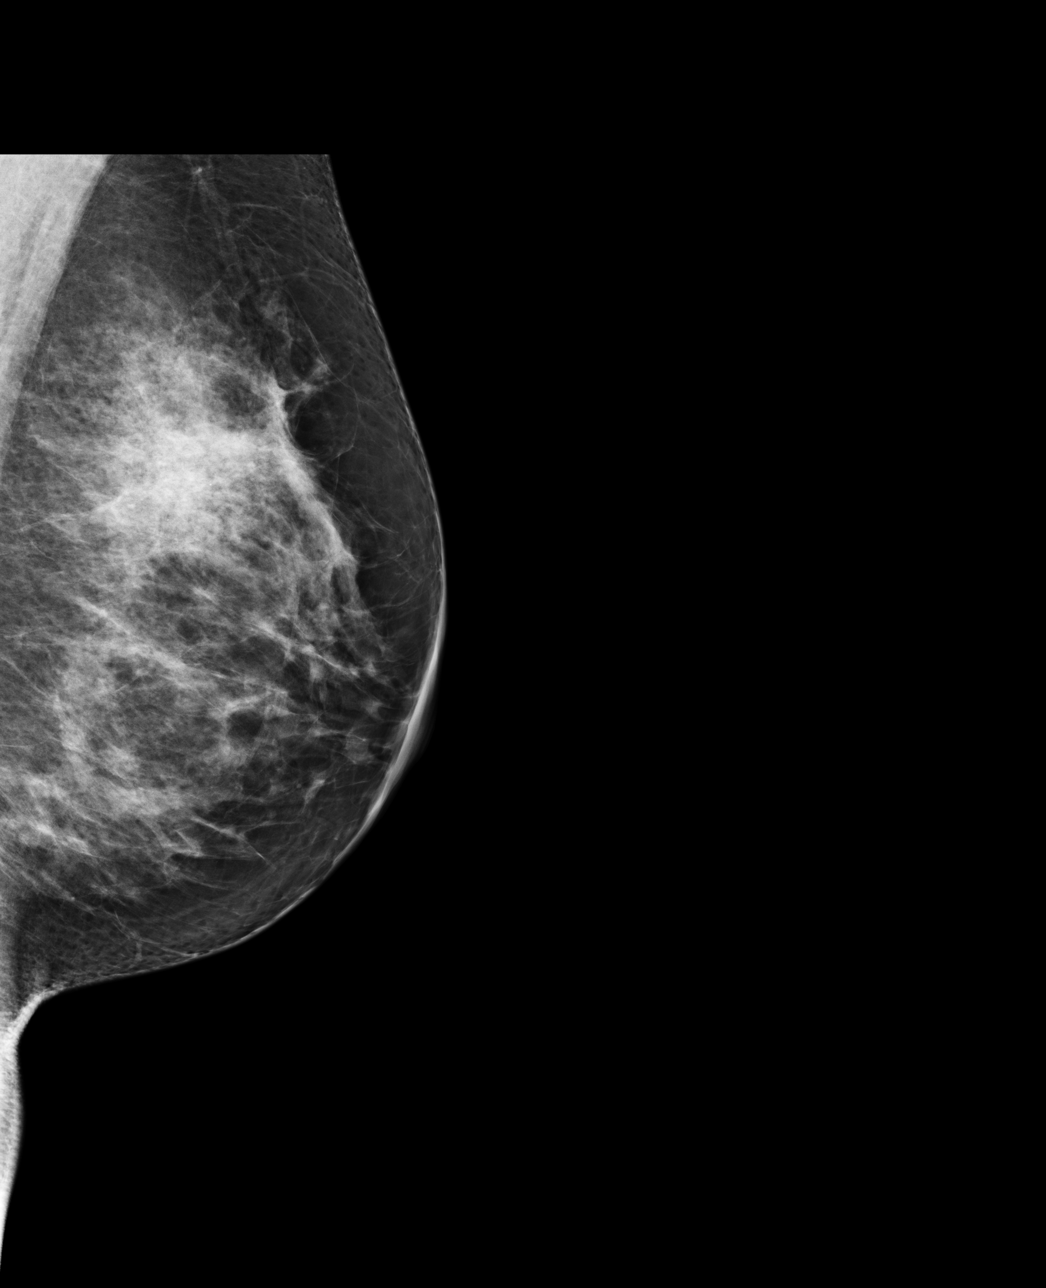

[4 of 4 positions shown; findings below may reference images not displayed]

IMPRESSION: No specific mammographic evidence of malignancy.  Next screening mammogram is recommended in one 
year.

A result letter of this screening mammogram will be mailed directly to the patient.

ASSESSMENT: Negative - BI-RADS 1

Screening mammogram in 1 year.
,

## 2010-05-18 ENCOUNTER — Other Ambulatory Visit: Admission: RE | Admit: 2010-05-18 | Discharge: 2010-05-18 | Payer: Self-pay | Admitting: Obstetrics and Gynecology

## 2010-05-18 ENCOUNTER — Ambulatory Visit: Payer: Self-pay | Admitting: Obstetrics and Gynecology

## 2010-06-03 ENCOUNTER — Ambulatory Visit: Payer: Self-pay | Admitting: Obstetrics and Gynecology

## 2010-06-27 ENCOUNTER — Ambulatory Visit (HOSPITAL_COMMUNITY): Admission: RE | Admit: 2010-06-27 | Discharge: 2010-06-27 | Payer: Self-pay | Admitting: Obstetrics and Gynecology

## 2010-06-27 IMAGING — MG MM DIGITAL SCREENING
4 series · 4 of 4 positions shown · non-contrast
Comparison: none

DG SCREEN MAMMOGRAM BILATERAL
Bilateral CC and MLO view(s) were taken.
Technologist: PADAM, RT RM

DIGITAL SCREENING MAMMOGRAM WITH CAD:
The breast tissue is heterogeneously dense.  No masses or malignant type calcifications are 
identified.  Compared with prior studies.
Images were processed with CAD.

[R CC]
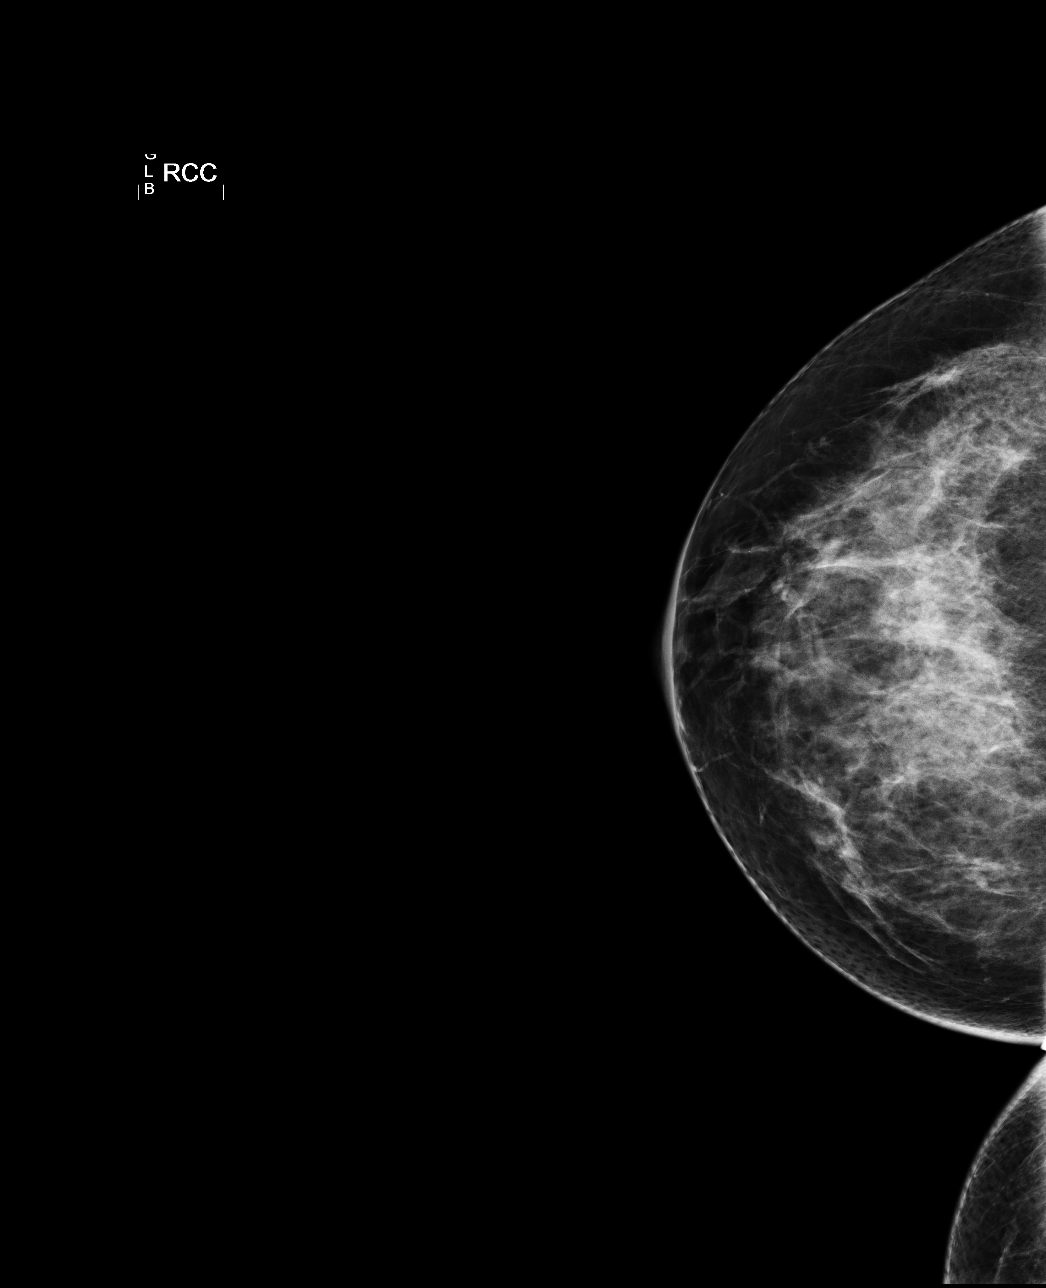

[R MLO]
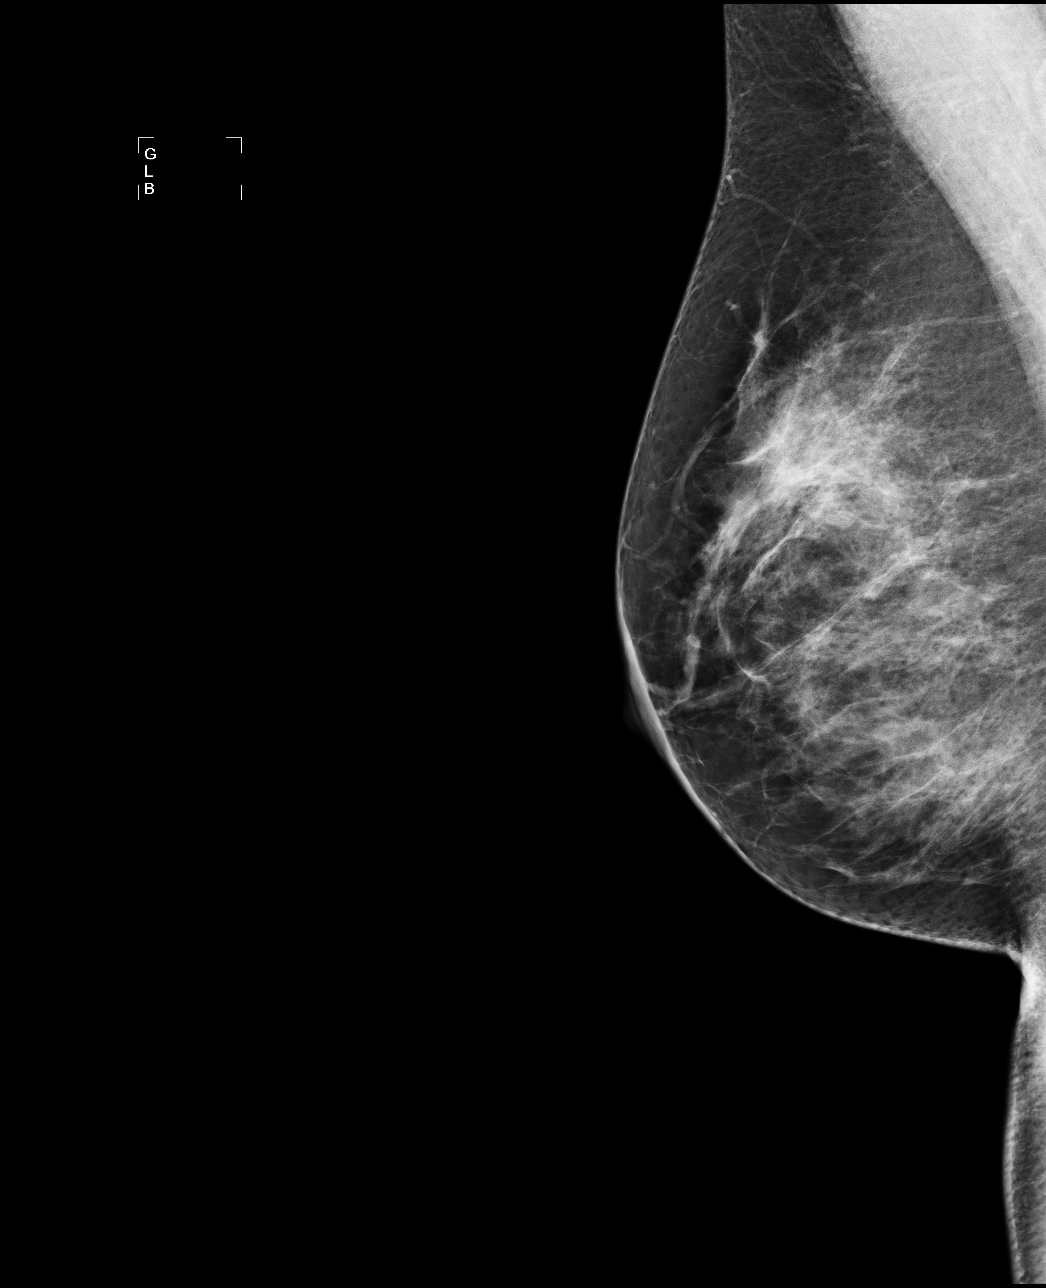

[L CC]
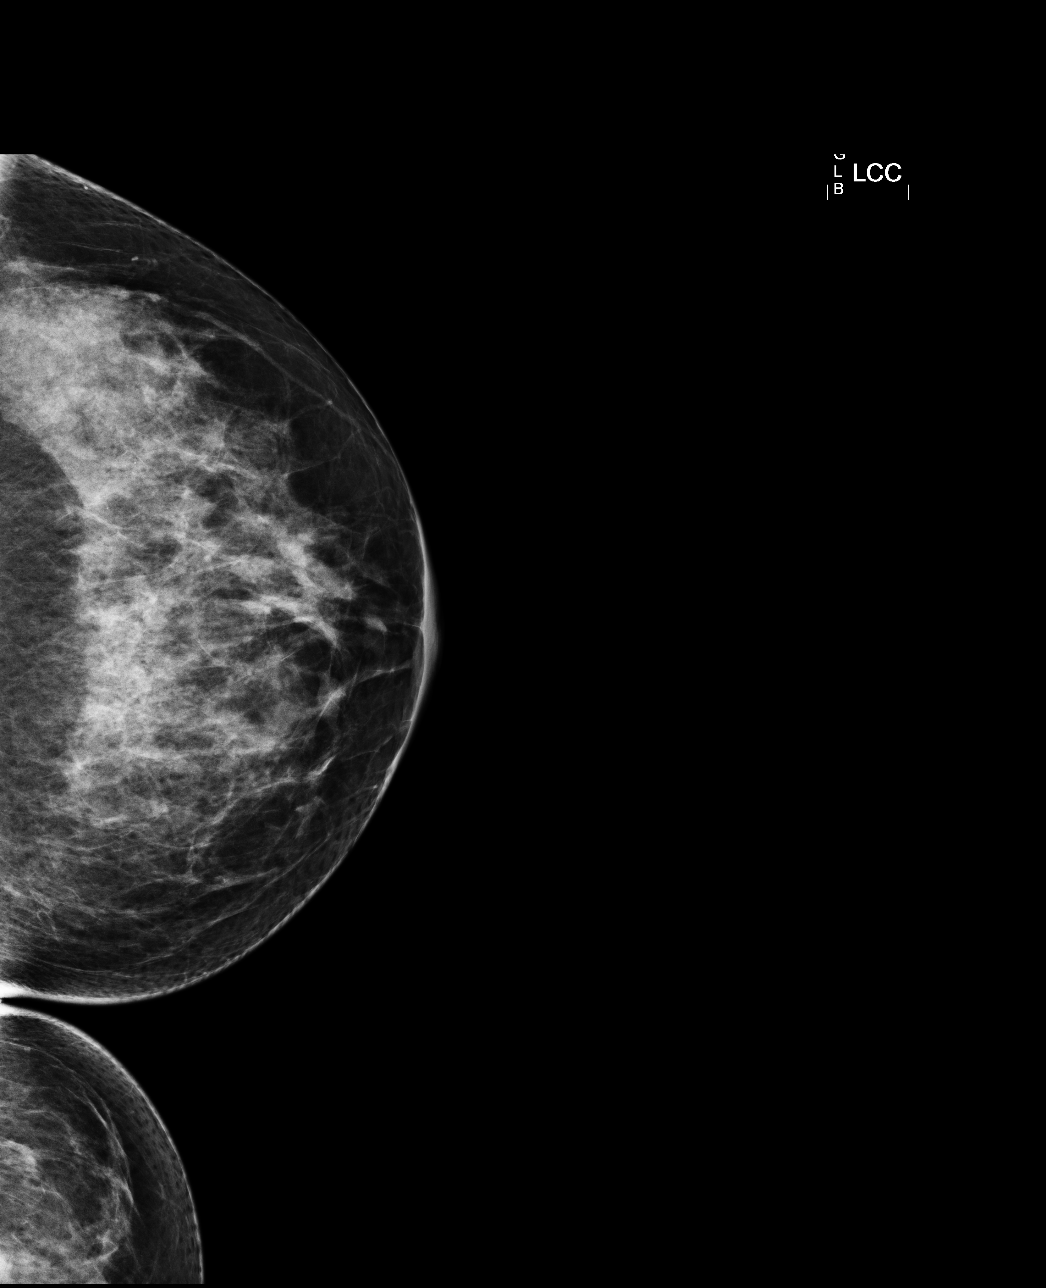

[L MLO]
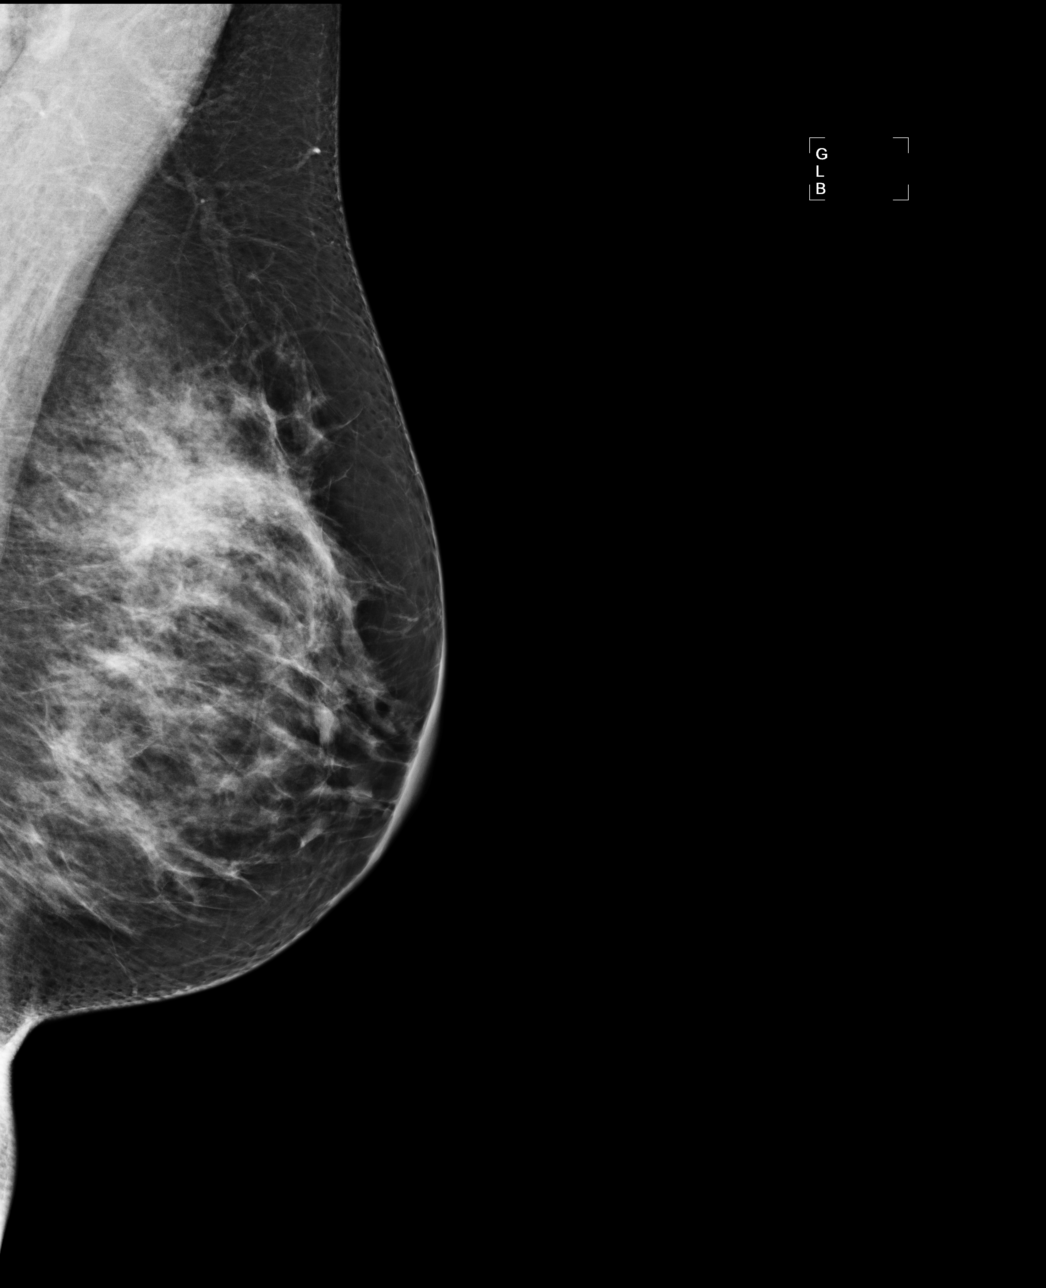

[4 of 4 positions shown; findings below may reference images not displayed]

IMPRESSION: No specific mammographic evidence of malignancy.  Next screening mammogram is recommended in one 
year.

A result letter of this screening mammogram will be mailed directly to the patient.

ASSESSMENT: Negative - BI-RADS 1

Screening mammogram in 1 year.
,

## 2010-09-04 ENCOUNTER — Encounter: Payer: Self-pay | Admitting: Obstetrics and Gynecology

## 2011-05-24 ENCOUNTER — Other Ambulatory Visit: Payer: Self-pay | Admitting: Obstetrics and Gynecology

## 2011-05-24 DIAGNOSIS — Z1231 Encounter for screening mammogram for malignant neoplasm of breast: Secondary | ICD-10-CM

## 2011-06-21 ENCOUNTER — Encounter: Payer: Self-pay | Admitting: Gynecology

## 2011-06-21 DIAGNOSIS — Z8742 Personal history of other diseases of the female genital tract: Secondary | ICD-10-CM | POA: Insufficient documentation

## 2011-06-30 ENCOUNTER — Ambulatory Visit (HOSPITAL_COMMUNITY)
Admission: RE | Admit: 2011-06-30 | Discharge: 2011-06-30 | Disposition: A | Discharge status: Home / Self Care | Payer: BC Managed Care – PPO | Source: Ambulatory Visit | Attending: Obstetrics and Gynecology | Admitting: Obstetrics and Gynecology

## 2011-06-30 DIAGNOSIS — Z1231 Encounter for screening mammogram for malignant neoplasm of breast: Secondary | ICD-10-CM

## 2011-06-30 IMAGING — MG MM DIGITAL SCREENING BILAT W/ CAD
5 series · 5 of 5 positions shown · non-contrast
Comparison: none

DG SCREEN MAMMOGRAM BILATERAL
Bilateral CC and MLO view(s) were taken.
Technologist: PADAM, RT RM

DIGITAL SCREENING MAMMOGRAM WITH CAD:
The breast tissue is heterogeneously dense.  No masses or malignant type calcifications are 
identified.  Compared with prior studies.
Images were processed with CAD.

[R CC]
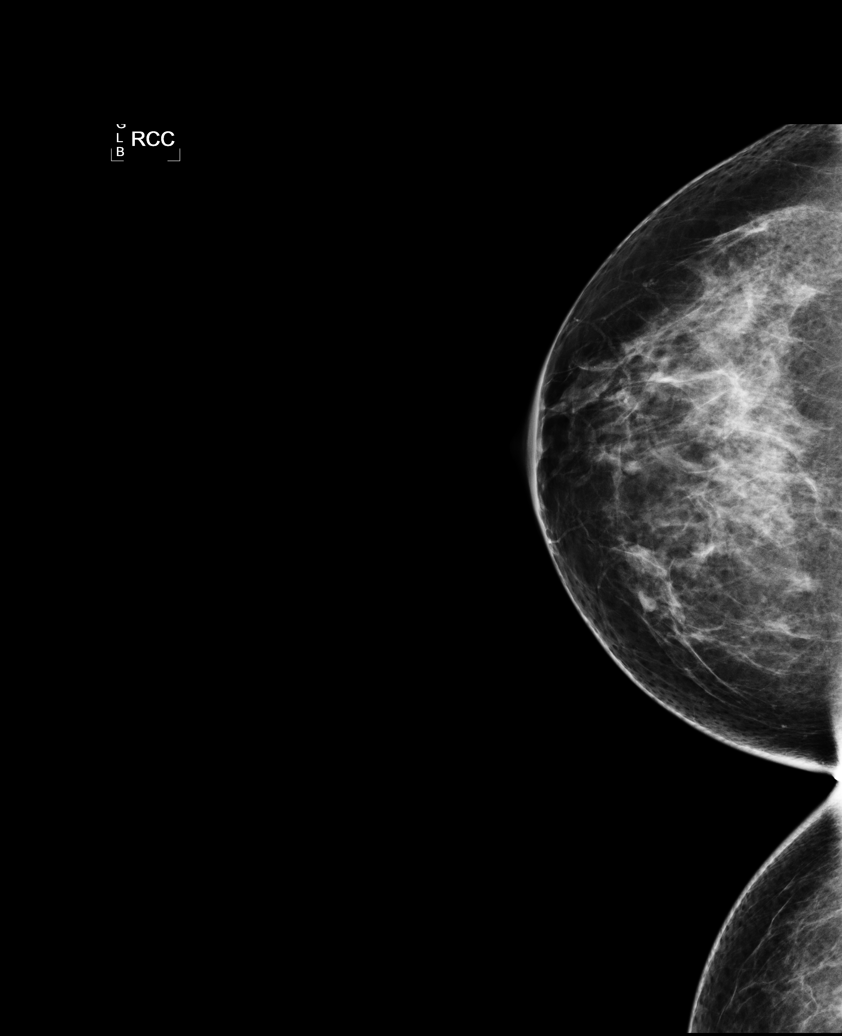

[R MLO]
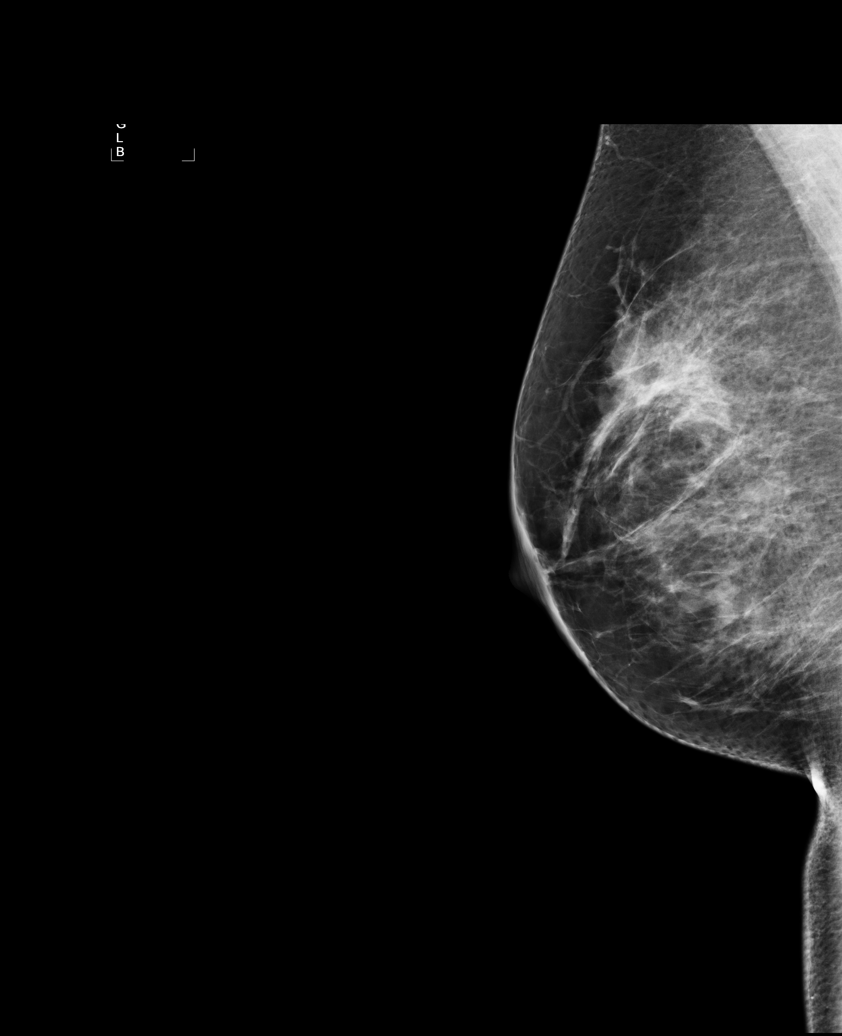

[L CC]
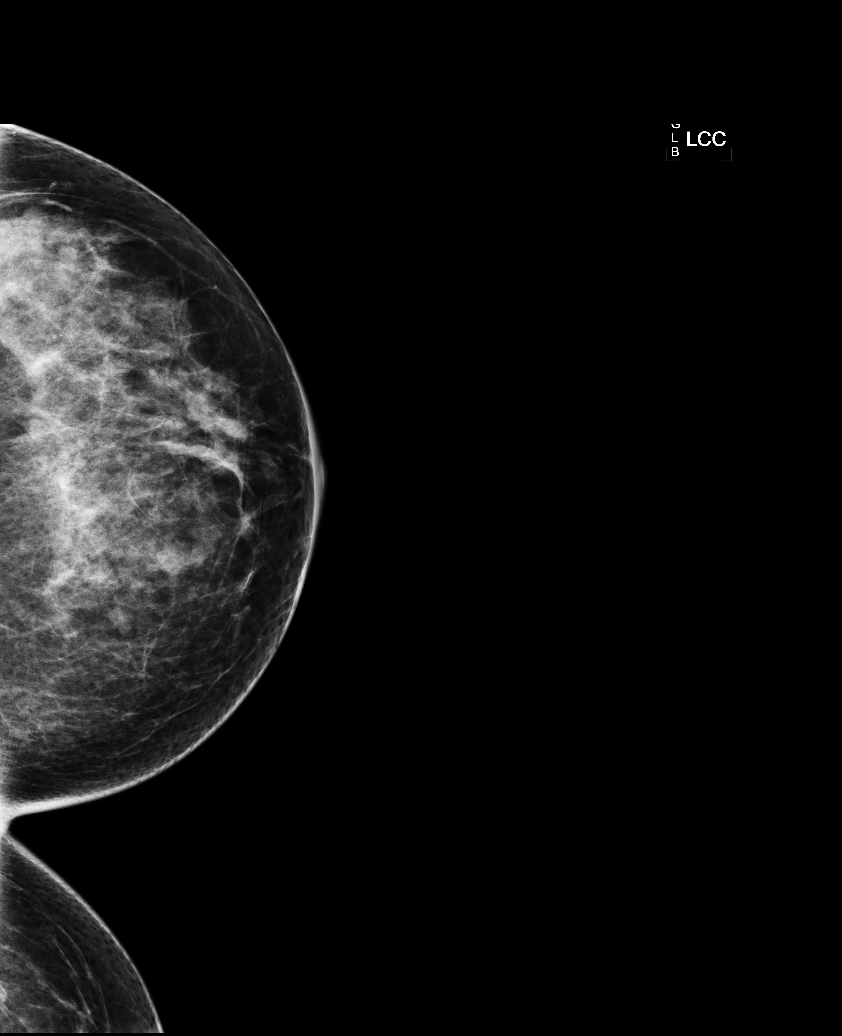

[L MLO (1 of 2)]
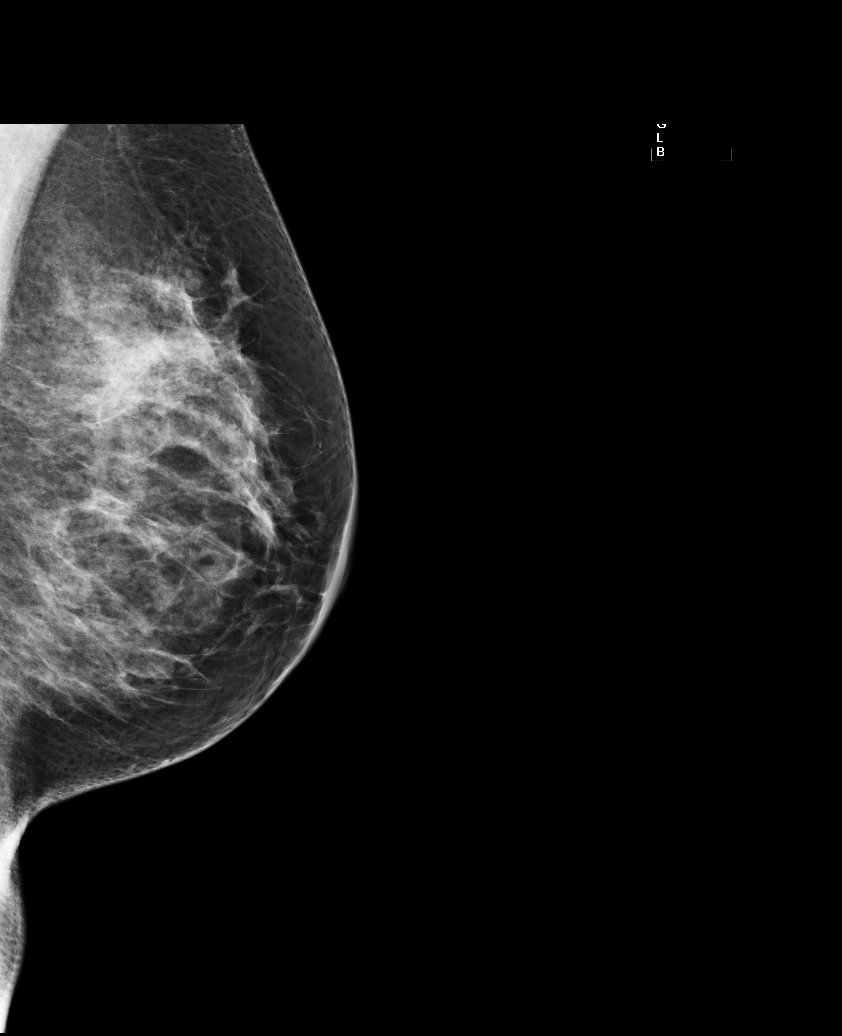

[L MLO (2 of 2)]
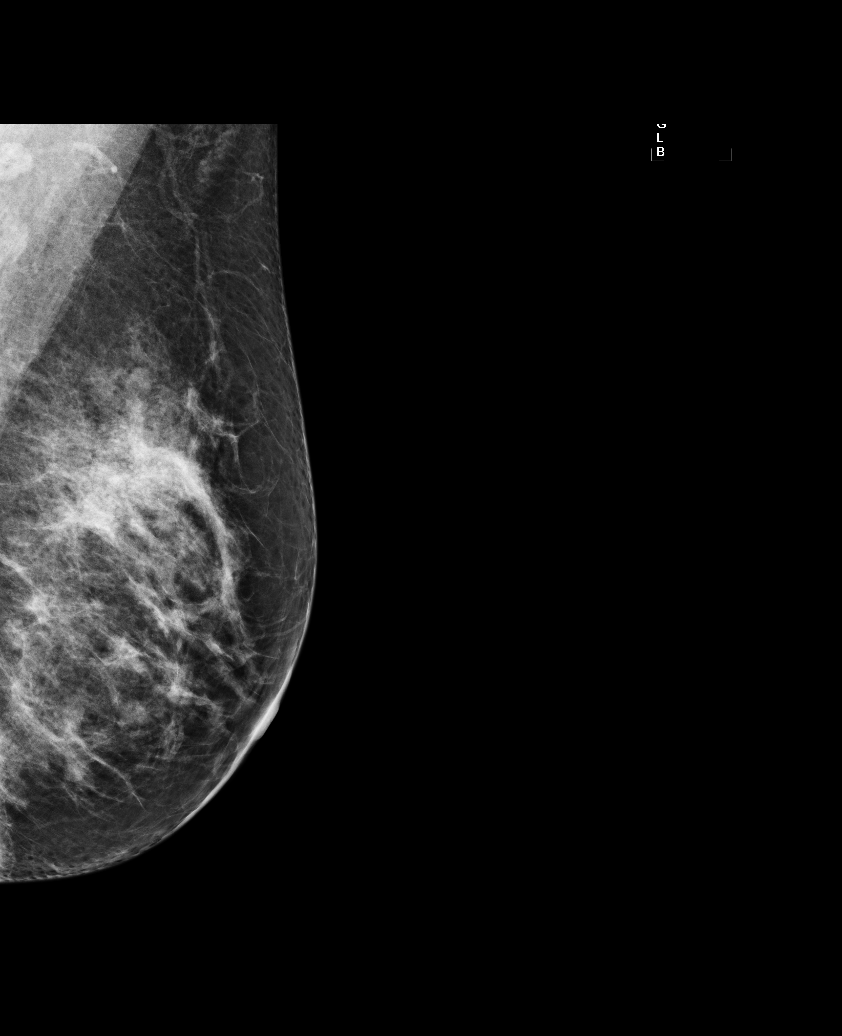

[5 of 5 positions shown; findings below may reference images not displayed]

IMPRESSION: No specific mammographic evidence of malignancy.  Next screening mammogram is recommended in one 
year.

A result letter of this screening mammogram will be mailed directly to the patient.

ASSESSMENT: Negative - BI-RADS 1

Screening mammogram in 1 year.
,

## 2011-07-10 ENCOUNTER — Ambulatory Visit (INDEPENDENT_AMBULATORY_CARE_PROVIDER_SITE_OTHER): Payer: BC Managed Care – PPO | Admitting: Obstetrics and Gynecology

## 2011-07-10 ENCOUNTER — Other Ambulatory Visit (HOSPITAL_COMMUNITY)
Admission: RE | Admit: 2011-07-10 | Discharge: 2011-07-10 | Disposition: A | Discharge status: Home / Self Care | Payer: BC Managed Care – PPO | Source: Ambulatory Visit | Attending: Obstetrics and Gynecology | Admitting: Obstetrics and Gynecology

## 2011-07-10 ENCOUNTER — Encounter: Payer: Self-pay | Admitting: Obstetrics and Gynecology

## 2011-07-10 VITALS — BP 110/70 | Ht 63.0 in | Wt 156.0 lb

## 2011-07-10 DIAGNOSIS — Z01419 Encounter for gynecological examination (general) (routine) without abnormal findings: Secondary | ICD-10-CM

## 2011-07-10 DIAGNOSIS — R82998 Other abnormal findings in urine: Secondary | ICD-10-CM

## 2011-07-10 NOTE — Progress Notes (Signed)
Patient came to see me today for her annual GYN exam. She is up-to-date on mammograms. Her periods are slightly on the heavy side. 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 She did not have endometriosis. She now has a PCP who is going to do her lab work. She is not having any pelvic pain or abnormal bleeding.  Physical examination:  Kennon Portela present HEENT within normal limits. Neck: Thyroid not large. No masses. Supraclavicular nodes: not enlarged. Breasts: Examined in both sitting midline position. No skin changes and no masses. Abdomen: Soft no guarding rebound or masses or hernia. Pelvic: External: Within normal limits. BUS: Within normal limits. Vaginal:within normal limits. Good estrogen effect. No evidence of cystocele rectocele or enterocele. Cervix: clean. Uterus: Normal size and shape. Adnexa: No masses. Rectovaginal exam: Confirmatory and negative. Extremities: Within normal limits.  Assessment: Normal GYN exam  Plan: Continuing mammogram. Lab work from her PCP. Discussed endometrial ablation but she feels her periods are not heavy enough to consider.

## 2011-07-10 NOTE — Progress Notes (Signed)
Addended by: Landis Martins R on: 07/10/2011 04:32 PM   Modules accepted: Orders

## 2012-05-30 ENCOUNTER — Other Ambulatory Visit: Payer: Self-pay | Admitting: Obstetrics and Gynecology

## 2012-05-30 DIAGNOSIS — Z1231 Encounter for screening mammogram for malignant neoplasm of breast: Secondary | ICD-10-CM

## 2012-07-01 ENCOUNTER — Ambulatory Visit (HOSPITAL_COMMUNITY)
Admission: RE | Admit: 2012-07-01 | Discharge: 2012-07-01 | Disposition: A | Discharge status: Home / Self Care | Payer: BC Managed Care – PPO | Source: Ambulatory Visit | Attending: Obstetrics and Gynecology | Admitting: Obstetrics and Gynecology

## 2012-07-01 DIAGNOSIS — Z1231 Encounter for screening mammogram for malignant neoplasm of breast: Secondary | ICD-10-CM

## 2012-07-01 IMAGING — MG MM DIGITAL SCREENING BILAT
4 series · 4 of 4 positions shown · non-contrast
Comparison: Previous exams.

CLINICAL DATA: Screening.

DIGITAL BILATERAL SCREENING MAMMOGRAM WITH CAD

[R CC]
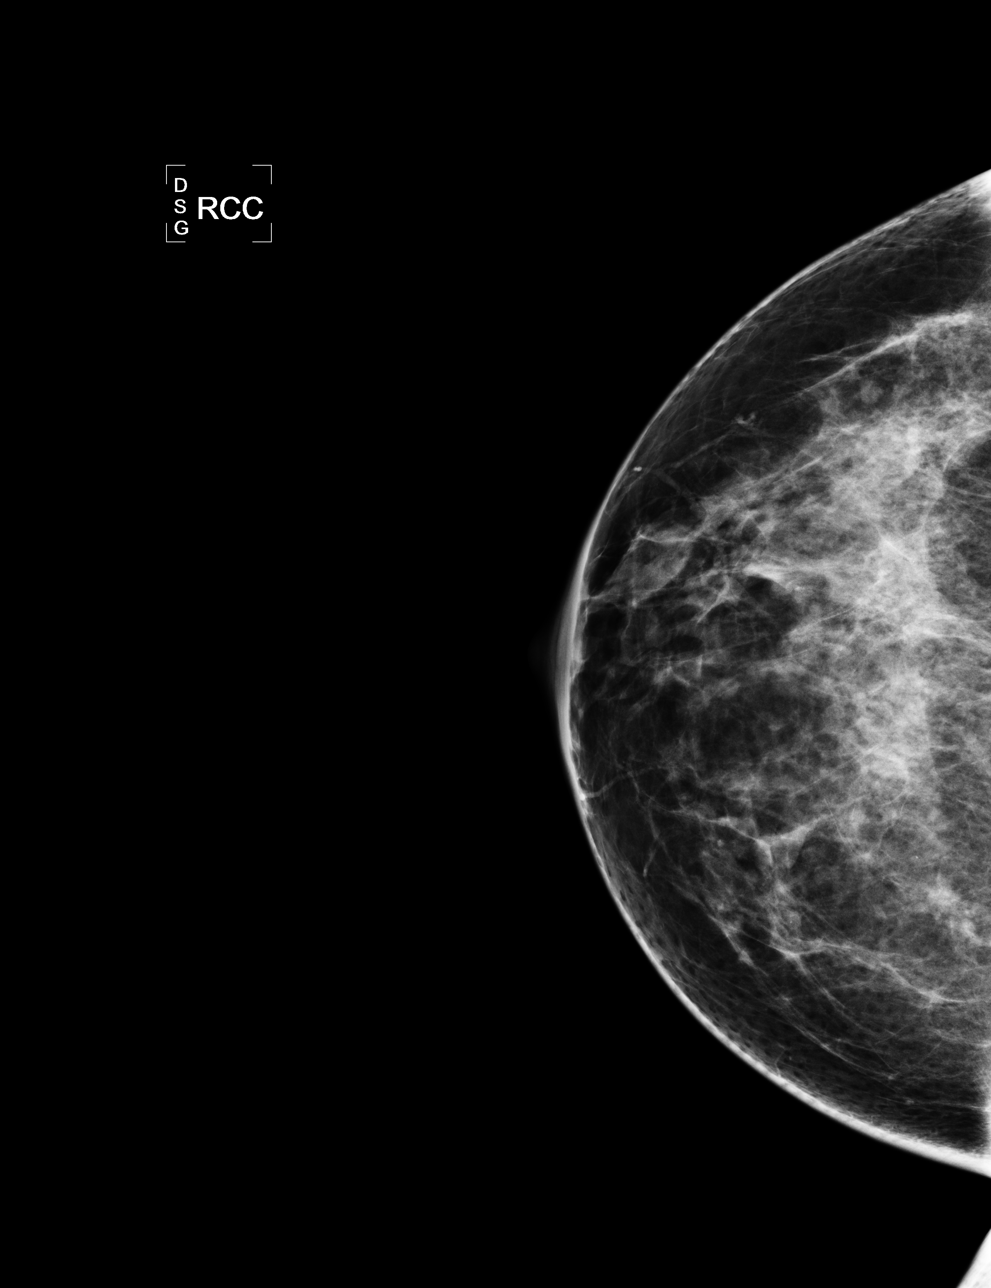

[R MLO]
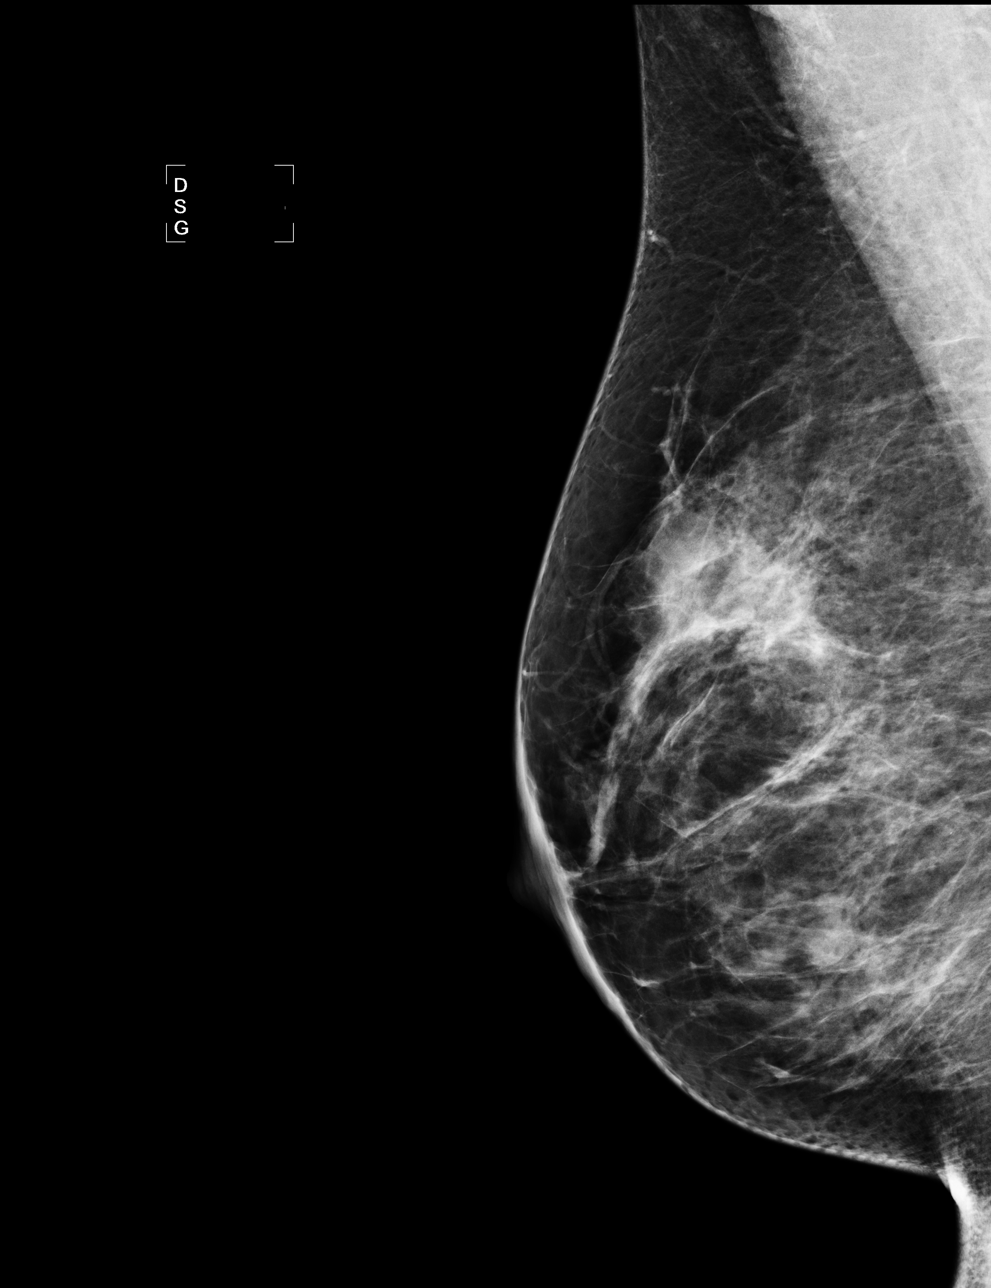

[L CC]
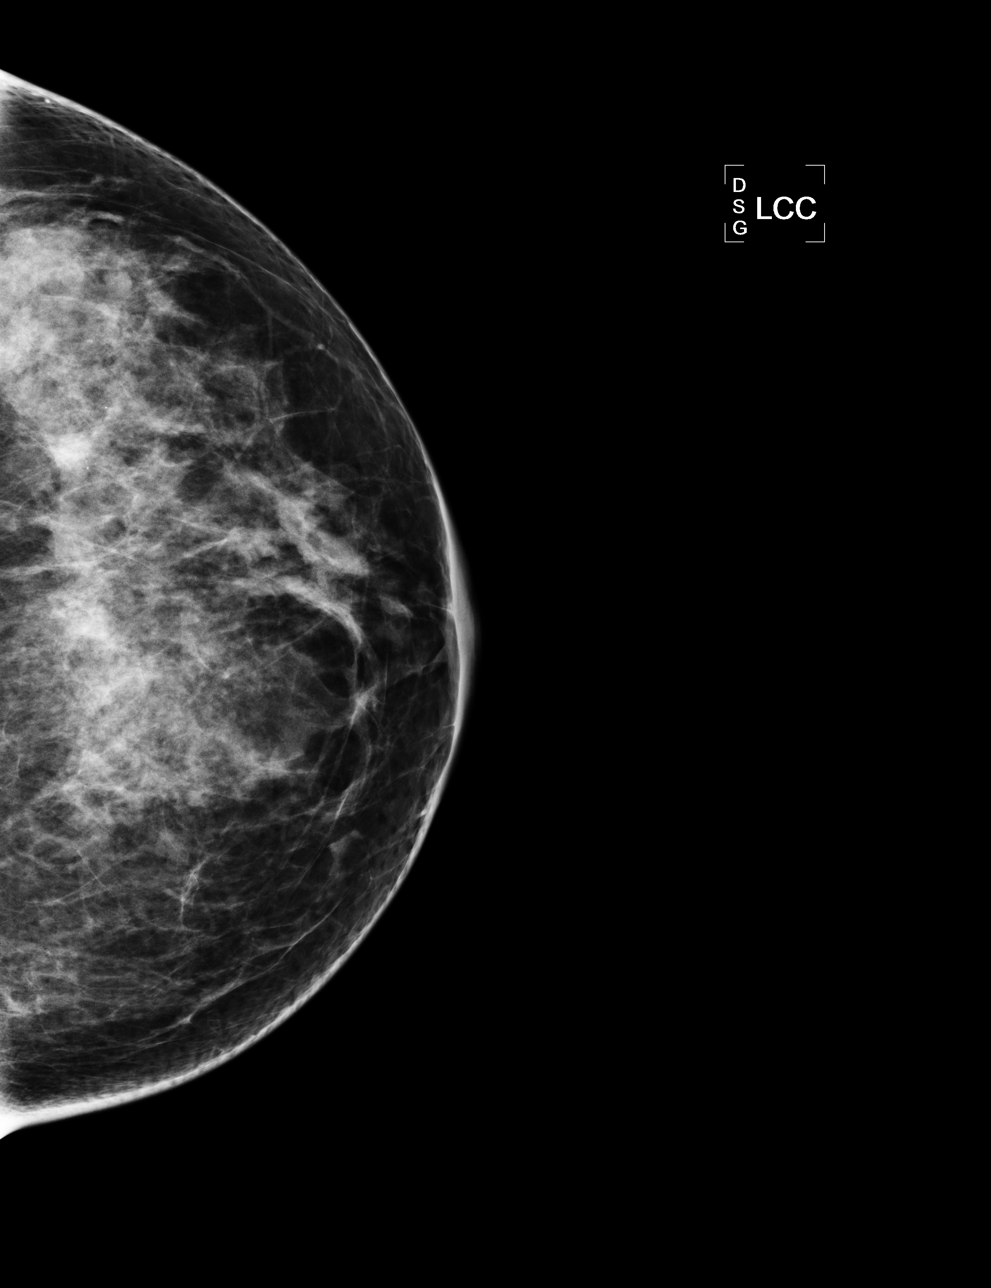

[L MLO]
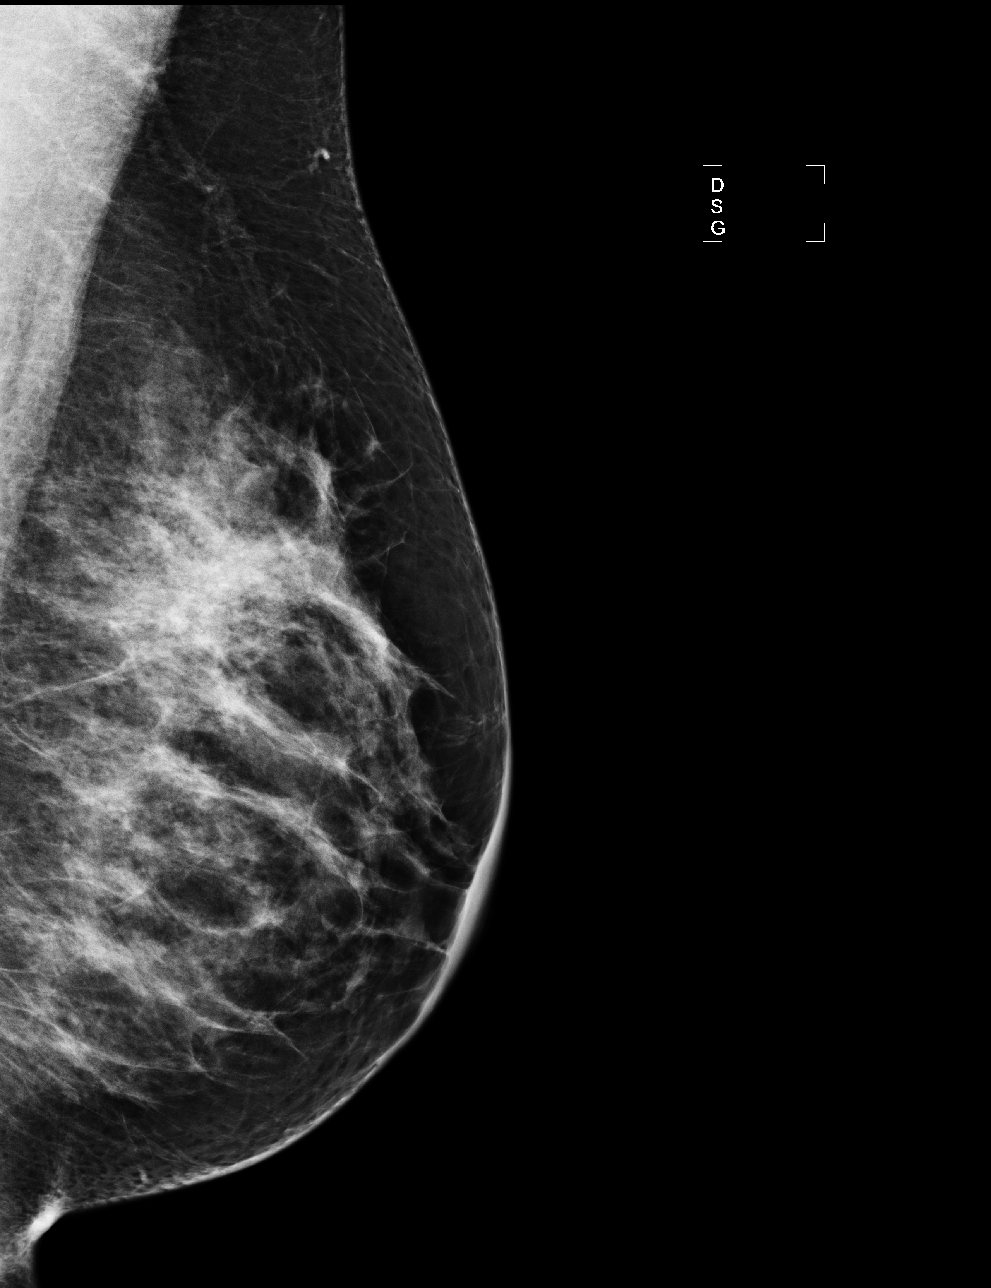

[4 of 4 positions shown; findings below may reference images not displayed]

FINDINGS: The breast tissue is heterogeneously dense. No
suspicious masses, architectural distortion, or calcifications are
present.

Images were processed with CAD.
IMPRESSION: No mammographic evidence of malignancy.

A result letter of this screening mammogram will be mailed directly
to the patient.

RECOMMENDATION:
Screening mammogram in one year. (Code:[F7])

BI-RADS CATEGORY 1:  Negative.

## 2012-07-15 ENCOUNTER — Ambulatory Visit (INDEPENDENT_AMBULATORY_CARE_PROVIDER_SITE_OTHER): Payer: BC Managed Care – PPO | Admitting: Obstetrics and Gynecology

## 2012-07-15 ENCOUNTER — Encounter: Payer: Self-pay | Admitting: Obstetrics and Gynecology

## 2012-07-15 VITALS — BP 120/80 | Ht 63.0 in | Wt 148.0 lb

## 2012-07-15 DIAGNOSIS — Z01419 Encounter for gynecological examination (general) (routine) without abnormal findings: Secondary | ICD-10-CM

## 2012-07-15 NOTE — Progress Notes (Signed)
Patient came to see me today for her annual GYN exam. Her cycles are regular. She has no abnormal bleeding. She has no pelvic pain. 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 She does not use contraception. She does her yearly mammogram. She does lab through PCP. She has always had normal Pap smears. Her last Pap smear was 2012. She's never had a colonoscopy.  Physical examination:Kim Julian Reil present. HEENT within normal limits. Neck: Thyroid not large. No masses. Supraclavicular nodes: not enlarged. Breasts: Examined in both sitting and lying  position. No skin changes and no masses. Abdomen: Soft no guarding rebound or masses or hernia. Pelvic: External: Within normal limits. BUS: Within normal limits. Vaginal:within normal limits. Good estrogen effect. No evidence of cystocele rectocele or enterocele. Cervix: clean. Uterus: Normal size and shape. Adnexa: No masses. Rectovaginal exam: Confirmatory and negative. Extremities: Within normal limits.  Assessment: Normal GYN exam  Plan: Continue yearly mammograms. Schedule colonoscopy. Pap not done.The new Pap smear guidelines were discussed with the patient.

## 2012-07-15 NOTE — Patient Instructions (Signed)
Schedule colonoscopy

## 2012-07-16 LAB — URINALYSIS W MICROSCOPIC + REFLEX CULTURE
Bilirubin Urine: NEGATIVE
Casts: NONE SEEN
Nitrite: NEGATIVE
Protein, ur: NEGATIVE mg/dL
Specific Gravity, Urine: 1.025 (ref 1.005–1.030)
pH: 6.5 (ref 5.0–8.0)

## 2012-07-17 LAB — URINE CULTURE

## 2012-07-18 ENCOUNTER — Other Ambulatory Visit: Payer: Self-pay | Admitting: Obstetrics and Gynecology

## 2012-07-18 DIAGNOSIS — N390 Urinary tract infection, site not specified: Secondary | ICD-10-CM

## 2012-07-18 MED ORDER — AMOXICILLIN 250 MG PO CAPS: 250.0000 mg | ORAL_CAPSULE | Freq: Three times a day (TID) | ORAL | Status: DC

## 2012-07-31 ENCOUNTER — Other Ambulatory Visit: Payer: BC Managed Care – PPO

## 2012-07-31 DIAGNOSIS — N390 Urinary tract infection, site not specified: Secondary | ICD-10-CM

## 2012-08-01 LAB — URINALYSIS W MICROSCOPIC + REFLEX CULTURE
Bacteria, UA: NONE SEEN
Casts: NONE SEEN
Crystals: NONE SEEN
Glucose, UA: NEGATIVE mg/dL
Ketones, ur: NEGATIVE mg/dL
Nitrite: NEGATIVE
Squamous Epithelial / LPF: NONE SEEN
Urobilinogen, UA: 0.2 mg/dL (ref 0.0–1.0)

## 2012-08-04 LAB — URINE CULTURE

## 2013-07-01 ENCOUNTER — Other Ambulatory Visit: Payer: Self-pay | Admitting: Gynecology

## 2013-07-01 DIAGNOSIS — Z1231 Encounter for screening mammogram for malignant neoplasm of breast: Secondary | ICD-10-CM

## 2013-07-16 ENCOUNTER — Encounter: Payer: Self-pay | Admitting: Gynecology

## 2013-07-16 ENCOUNTER — Ambulatory Visit (INDEPENDENT_AMBULATORY_CARE_PROVIDER_SITE_OTHER): Payer: BC Managed Care – PPO | Admitting: Gynecology

## 2013-07-16 ENCOUNTER — Ambulatory Visit (HOSPITAL_COMMUNITY)
Admission: RE | Admit: 2013-07-16 | Discharge: 2013-07-16 | Disposition: A | Discharge status: Home / Self Care | Payer: BC Managed Care – PPO | Source: Ambulatory Visit | Attending: Gynecology | Admitting: Gynecology

## 2013-07-16 VITALS — BP 120/70 | Ht 63.5 in | Wt 146.4 lb

## 2013-07-16 DIAGNOSIS — N912 Amenorrhea, unspecified: Secondary | ICD-10-CM

## 2013-07-16 DIAGNOSIS — N9489 Other specified conditions associated with female genital organs and menstrual cycle: Secondary | ICD-10-CM

## 2013-07-16 DIAGNOSIS — N951 Menopausal and female climacteric states: Secondary | ICD-10-CM

## 2013-07-16 DIAGNOSIS — Z1159 Encounter for screening for other viral diseases: Secondary | ICD-10-CM

## 2013-07-16 DIAGNOSIS — Z01419 Encounter for gynecological examination (general) (routine) without abnormal findings: Secondary | ICD-10-CM

## 2013-07-16 DIAGNOSIS — Z1231 Encounter for screening mammogram for malignant neoplasm of breast: Secondary | ICD-10-CM

## 2013-07-16 DIAGNOSIS — N898 Other specified noninflammatory disorders of vagina: Secondary | ICD-10-CM

## 2013-07-16 LAB — PROLACTIN: Prolactin: 8.3 ng/mL

## 2013-07-16 LAB — TSH: TSH: 1.378 u[IU]/mL (ref 0.350–4.500)

## 2013-07-16 LAB — FOLLICLE STIMULATING HORMONE: FSH: 11.4 m[IU]/mL

## 2013-07-16 MED ORDER — MEDROXYPROGESTERONE ACETATE 10 MG PO TABS: 10.0000 mg | ORAL_TABLET | Freq: Every day | ORAL | Status: DC

## 2013-07-16 NOTE — Progress Notes (Signed)
Candice Lee 1961/03/30 [id number]   History:    52 y.o.  for annual gyn exam with the only complaint is that she has not had a menstrual cycle since the first week of October. 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 Patient denies any nausea, vomiting, nipple discharge, headache or visual disturbances. Patient has 2 adopted children. Patient has been under a lot of stress recently since her husband has had several seizures and is being evaluated.  Urine pregnancy test today in the office negative  Past medical history,surgical history, family history and social history were all reviewed and documented in the EPIC chart.  Gynecologic History Patient's last menstrual period was 05/16/2013. Contraception: none Last Pap: 2012. Results were: normal Last mammogram: 2013. Results were: normal but dense  Obstetric History OB History  Gravida Para Term Preterm AB SAB TAB Ectopic Multiple Living  0                  ROS: A ROS was performed and pertinent positives and negatives are included in the history.  GENERAL: No fevers or chills. HEENT: No change in vision, no earache, sore throat or sinus congestion. NECK: No pain or stiffness. CARDIOVASCULAR: No chest pain or pressure. No palpitations. PULMONARY: No shortness of breath, cough or wheeze. GASTROINTESTINAL: No abdominal pain, nausea, vomiting or diarrhea, melena or bright red blood per rectum. GENITOURINARY: No urinary frequency, urgency, hesitancy or dysuria. MUSCULOSKELETAL: No joint or muscle pain, no back pain, no recent trauma. DERMATOLOGIC: No rash, no itching, no lesions. ENDOCRINE: No polyuria, polydipsia, no heat or cold intolerance. No recent change in weight. HEMATOLOGICAL: No anemia or easy bruising or bleeding. NEUROLOGIC: No headache, seizures, numbness, tingling or weakness. PSYCHIATRIC: No depression, no loss of interest in normal  activity or change in sleep pattern.     Exam: chaperone present  BP 120/70  Ht 5' 3.5" (1.613 m)  Wt 146 lb 6.4 oz (66.407 kg)  BMI 25.52 kg/m2  LMP 05/16/2013  Body mass index is 25.52 kg/(m^2).  General appearance : Well developed well nourished female. No acute distress HEENT: Neck supple, trachea midline, no carotid bruits, no thyroidmegaly Lungs: Clear to auscultation, no rhonchi or wheezes, or rib retractions  Heart: Regular rate and rhythm, no murmurs or gallops Breast:Examined in sitting and supine position were symmetrical in appearance, no palpable masses or tenderness,  no skin retraction, no nipple inversion, no nipple discharge, no skin discoloration, no axillary or supraclavicular lymphadenopathy Abdomen: no palpable masses or tenderness, no rebound or guarding Extremities: no edema or skin discoloration or tenderness  Pelvic:  Bartholin, Urethra, Skene Glands: Within normal limits             Vagina: No gross lesions or discharge  Cervix: No gross lesions or discharge  Uterus  anteverted, normal size, shape and consistency, non-tender and mobile  Adnexa  Without masses or tenderness  Anus and perineum  normal   Rectovaginal  normal sphincter tone without palpated masses or tenderness             Hemoccult colonoscopy this year normal     Assessment/Plan:  52 y.o. female for annual exam perimenopause. Patient with no basal motor symptoms only vaginal dryness at times which she uses lubricant as needed for intercourse. Patient scheduled for mammogram today have encouraged her to request a 3-D since review of her last mammogram demonstrated she had dense breast.  We discussed importance of monthly breast exam. We also discussed importance of calcium vitamin D and regular exercise for osteoporosis prevention. Pap smear was not done today and according to the guidelines. The following labs were ordered: TSH, prolactin, and FSH. It by the early part of next week she does  not having menses and these labs are normal she will take Provera 10 mg 1 by mouth daily for 10 days to Initiates menses. I do believe she is getting ready to start because there was blood-tinged mucoid material in her cervical os today.  New CDC guidelines is recommending patients be tested once in her lifetime for hepatitis C antibody who were born between [birth year] through 1964. This was discussed with the patient today and has agreed to be tested today.   Literature and information the perimenopause as well as hormone replacement therapy was provided.  Note: This dictation was prepared with  Dragon/digital dictation along withSmart phrase technology. Any transcriptional errors that result from this process are unintentional.   Ok Edwards MD, 9:09 AM 07/16/2013

## 2013-07-16 NOTE — Addendum Note (Signed)
Addended by: Bertram Savin A on: 07/16/2013 11:53 AM   Modules accepted: Orders

## 2013-07-16 NOTE — Patient Instructions (Addendum)
Hormone Therapy At menopause, your body begins making less estrogen and progesterone hormones. This causes the body to stop having menstrual periods. This is because estrogen and progesterone hormones control your periods and menstrual cycle. A lack of estrogen may cause symptoms such as:  Hot flushes (or hot flashes).  Vaginal dryness.  Dry skin.  Loss of sex drive.  Risk of bone loss (osteoporosis). When this happens, you may choose to take hormone therapy to get back the estrogen lost during menopause. When the hormone estrogen is given alone, it is usually referred to as ET (Estrogen Therapy). When the hormone progestin is combined with estrogen, it is generally called HT (Hormone Therapy). This was formerly known as hormone replacement therapy (HRT). Your caregiver can help you make a decision on what will be best for you. The decision to use HT seems to change often as new studies are done. Many studies do not agree on the benefits of hormone replacement therapy. LIKELY BENEFITS OF HT INCLUDE PROTECTION FROM:  Hot Flushes (also called hot flashes) - A hot flush is a sudden feeling of heat that spreads over the face and body. The skin may redden like a blush. It is connected with sweats and sleep disturbance. Women going through menopause may have hot flushes a few times a month or several times per day depending on the woman.  Osteoporosis (bone loss)- Estrogen helps guard against bone loss. After menopause, a woman's bones slowly lose calcium and become weak and brittle. As a result, bones are more likely to break. The hip, wrist, and spine are affected most often. Hormone therapy can help slow bone loss after menopause. Weight bearing exercise and taking calcium with vitamin D also can help prevent bone loss. There are also medications that your caregiver can prescribe that can help prevent osteoporosis.  Vaginal Dryness - Loss of estrogen causes changes in the vagina. Its lining may  become thin and dry. These changes can cause pain and bleeding during sexual intercourse. Dryness can also lead to infections. This can cause burning and itching. (Vaginal estrogen treatment can help relieve pain, itching, and dryness.)  Urinary Tract Infections are more common after menopause because of lack of estrogen. Some women also develop urinary incontinence because of low estrogen levels in the vagina and bladder.  Possible other benefits of estrogen include a positive effect on mood and short-term memory in women. RISKS AND COMPLICATIONS  Using estrogen alone without progesterone causes the lining of the uterus to grow. This increases the risk of lining of the uterus (endometrial) cancer. Your caregiver should give another hormone called progestin if you have a uterus.  Women who take combined (estrogen and progestin) HT appear to have an increased risk of breast cancer. The risk appears to be small, but increases throughout the time that HT is taken.  Combined therapy also makes the breast tissue slightly denser which makes it harder to read mammograms (breast X-rays).  Combined, estrogen and progesterone therapy can be taken together every day, in which case there may be spotting of blood. HT therapy can be taken cyclically in which case you will have menstrual periods. Cyclically means HT is taken for a set amount of days, then not taken, then this process is repeated.  HT may increase the risk of stroke, heart attack, breast cancer and forming blood clots in your leg.  Transdermal estrogen (estrogen that is absorbed through the skin with a patch or a cream) may have more positive results with:    Cholesterol.  Blood pressure.  Blood clots. Having the following conditions may indicate you should not have HT:  Endometrial cancer.  Liver disease.  Breast cancer.  Heart disease.  History of blood clots.  Stroke. TREATMENT   If you choose to take HT and have a uterus,  usually estrogen and progestin are prescribed.  Your caregiver will help you decide the best way to take the medications.  Possible ways to take estrogen include:  Pills.  Patches.  Gels.  Sprays.  Vaginal estrogen cream, rings and tablets.  It is best to take the lowest dose possible that will help your symptoms and take them for the shortest period of time that you can.  Hormone therapy can help relieve some of the problems (symptoms) that affect women at menopause. Before making a decision about HT, talk to your caregiver about what is best for you. Be well informed and comfortable with your decisions. HOME CARE INSTRUCTIONS   Follow your caregivers advice when taking the medications.  A Pap test is done to screen for cervical cancer.  The first Pap test should be done at age 74.  Between ages 39 and 31, Pap tests are repeated every 2 years.  Beginning at age 39, you are advised to have a Pap test every 3 years as long as your past 3 Pap tests have been normal.  Some women have medical problems that increase the chance of getting cervical cancer. Talk to your caregiver about these problems. It is especially important to talk to your caregiver if a new problem develops soon after your last Pap test. In these cases, your caregiver may recommend more frequent screening and Pap tests.  The above recommendations are the same for women who have or have not gotten the vaccine for HPV (Human Papillomavirus).  If you had a hysterectomy for a problem that was not a cancer or a condition that could lead to cancer, then you no longer need Pap tests. However, even if you no longer need a Pap test, a regular exam is a good idea to make sure no other problems are starting.   If you are between ages 13 and 31, and you have had normal Pap tests going back 10 years, you no longer need Pap tests. However, even if you no longer need a Pap test, a regular exam is a good idea to make sure no  other problems are starting.   If you have had past treatment for cervical cancer or a condition that could lead to cancer, you need Pap tests and screening for cancer for at least 20 years after your treatment.  If Pap tests have been discontinued, risk factors (such as a new sexual partner) need to be re-assessed to determine if screening should be resumed.  Some women may need screenings more often if they are at high risk for cervical cancer.  Get mammograms done as per the advice of your caregiver. SEEK IMMEDIATE MEDICAL CARE IF:  You develop abnormal vaginal bleeding.  You have pain or swelling in your legs, shortness of breath, or chest pain.  You develop dizziness or headaches.  You have lumps or changes in your breasts or armpits.  You have slurred speech.  You develop weakness or numbness of your arms or legs.  You have pain, burning, or bleeding when urinating.  You develop abdominal pain. Document Released: 04/29/2003 Document Revised: 10/23/2011 Document Reviewed: 08/17/2010 [facility] Patient Information 2014 [place], [place]. Perimenopause Perimenopause is the time when  your body begins to move into the menopause (no menstrual period for 12 straight months). It is a natural process. Perimenopause can begin 2 to 8 years before the menopause and usually lasts for one year after the menopause. During this time, your ovaries may or may not produce an egg. The ovaries vary in their production of estrogen and progesterone hormones each month. This can cause irregular menstrual periods, difficulty in getting pregnant, vaginal bleeding between periods and uncomfortable symptoms. CAUSES  Irregular production of the ovarian hormones, estrogen and progesterone, and not ovulating every month.  Other causes include:  Tumor of the pituitary gland in the brain.  Medical disease that affects the ovaries.  Radiation treatment.  Chemotherapy.  Unknown causes.  Heavy  smoking and excessive alcohol intake can bring on perimenopause sooner. SYMPTOMS   Hot flashes.  Night sweats.  Irregular menstrual periods.  Decrease sex drive.  Vaginal dryness.  Headaches.  Mood swings.  Depression.  Memory problems.  Irritability.  Tiredness.  Weight gain.  Trouble getting pregnant.  The beginning of losing bone cells (osteoporosis).  The beginning of hardening of the arteries (atherosclerosis). DIAGNOSIS  Your caregiver will make a diagnosis by analyzing your age, menstrual history and your symptoms. They will do a physical exam noting any changes in your body, especially your female organs. Female hormone tests may or may not be helpful depending on the amount and when you produce the female hormones. However, other hormone tests may be helpful (ex. thyroid hormone) to rule out other problems. TREATMENT  The decision to treat during the perimenopause should be made by you and your caregiver depending on how the symptoms are affecting you and your life style. There are various treatments available such as:  Treating individual symptoms with a specific medication for that symptom (ex. tranquilizer for depression).  Herbal medications that can help specific symptoms.  Counseling.  Group therapy.  No treatment. HOME CARE INSTRUCTIONS   Before seeing your caregiver, make a list of your menstrual periods (when the occur, how heavy they are, how long between periods and how long they last), your symptoms and when they started.  Take the medication as recommended by your caregiver.  Sleep and rest.  Exercise.  Eat a diet that contains calcium (good for your bones) and soy (acts like estrogen hormone).  Do not smoke.  Avoid alcoholic beverages.  Taking vitamin E may help in certain cases.  Take calcium and vitamin D supplements to help prevent bone loss.  Group therapy is sometimes helpful.  Acupuncture may help in some cases. SEEK  MEDICAL CARE IF:   You have any of the above and want to know if it is perimenopause.  You want advice and treatment for any of your symptoms mentioned above.  You need a referral to a specialist (gynecologist, psychiatrist or psychologist). SEEK IMMEDIATE MEDICAL CARE IF:   You have vaginal bleeding.  Your period lasts longer than 8 days.  You periods are recurring sooner than 21 days.  You have bleeding after intercourse.  You have severe depression.  You have pain when you urinate.  You have severe headaches.  You develop vision problems. Document Released: 09/07/2004 Document Revised: 10/23/2011 Document Reviewed: 02/27/2013 [facility] Patient Information 2014 [place], [place]. Tetanus, Diphtheria (Td) Vaccine What You Need to Know WHY GET VACCINATED? Tetanus  and diphtheria are very serious diseases. They are rare in the [place] today, but people who do become infected often have severe complications. Td vaccine is  used to protect adolescents and adults from both of these diseases. Both tetanus and diphtheria are infections caused by bacteria. Diphtheria spreads from person to person through coughing or sneezing. Tetanus-causing bacteria enter the body through cuts, scratches, or wounds. TETANUS (Lockjaw) causes painful muscle tightening and stiffness, usually all over the body.  It can lead to tightening of muscles in the head and neck so you can't open your mouth, swallow, or sometimes even breathe. Tetanus kills about 1 out of every 5 people who are infected. DIPHTHERIA can cause a thick coating to form in the back of the throat.  It can lead to breathing problems, paralysis, heart failure, and death. Before vaccines, the [place] saw as many as 200,000 cases a year of diphtheria and hundreds of cases of tetanus. Since vaccination began, cases of both diseases have dropped by about 99%. TD VACCINE Td vaccine can protect adolescents and adults from tetanus and  diphtheria. Td is usually given as a booster dose every 10 years but it can also be given earlier after a severe and dirty wound or burn. Your doctor can give you more information. Td may safely be given at the same time as other vaccines. SOME PEOPLE SHOULD NOT GET THIS VACCINE  If you ever had a life-threatening allergic reaction after a dose of any tetanus or diphtheria containing vaccine, OR if you have a severe allergy to any part of this vaccine, you should not get Td. Tell your doctor if you have any severe allergies.  Talk to your doctor if you:  have epilepsy or another nervous system problem,  had severe pain or swelling after any vaccine containing diphtheria or tetanus,  ever had Guillain Barr Syndrome (GBS),  aren't feeling well on the day the shot is scheduled. RISKS OF A VACCINE REACTION With a vaccine, like any medicine, there is a chance of side effects. These are usually mild and go away on their own. Serious side effects are also possible, but are very rare. Most people who get Td vaccine do not have any problems with it. Mild Problems  following Td (Did not interfere with activities)  Pain where the shot was given (about 8 people in 10)  Redness or swelling where the shot was given (about 1 person in 3)  Mild fever (about 1 person in 15)  Headache or Tiredness (uncommon) Moderate Problems following Td (Interfered with activities, but did not require medical attention)  Fever over 102 F (38.9 C) (rare) Severe Problems  following Td (Unable to perform usual activities; required medical attention)  Swelling, severe pain, bleeding, or redness in the arm where the shot was given (rare). Problems that could happen after any vaccine:  Brief fainting spells can happen after any medical procedure, including vaccination. Sitting or lying down for about 15 minutes can help prevent fainting, and injuries caused by a fall. Tell your doctor if you feel dizzy, or have  vision changes or ringing in the ears.  Severe shoulder pain and reduced range of motion in the arm where a shot was given can happen, very rarely, after a vaccination.  Severe allergic reactions from a vaccine are very rare, estimated at less than 1 in a million doses. If one were to occur, it would usually be within a few minutes to a few hours after the vaccination. WHAT IF THERE IS A SERIOUS REACTION? What should I look for?  Look for anything that concerns you, such as signs of a severe allergic reaction,  very high fever, or behavior changes. Signs of a severe allergic reaction can include hives, swelling of the face and throat, difficulty breathing, a fast heartbeat, dizziness, and weakness. These would usually start a few minutes to a few hours after the vaccination. What should I do?  If you think it is a severe allergic reaction or other emergency that can't wait, call 911 or get the person to the nearest hospital. Otherwise, call your doctor.  Afterward, the reaction should be reported to the Vaccine Adverse Event Reporting System (VAERS). Your doctor might file this report, or, you can do it yourself through the VAERS website or by calling 1-431-687-1468. VAERS is only for reporting reactions. They do not give medical advice. THE NATIONAL VACCINE INJURY COMPENSATION PROGRAM The National Vaccine Injury Compensation Program (VICP) is a federal program that was created to compensate people who may have been injured by certain vaccines. Persons who believe they may have been injured by a vaccine can learn about the program and about filing a claim by calling 1-(601)888-5013 or visiting the [facility] website. HOW CAN I LEARN MORE?  Ask your doctor.  Contact your local or state health department.  Contact the Centers for Disease Control and Prevention (CDC):  Call (405)669-7575 (1-800-CDC-INFO)  Visit CDC's vaccines website CDC Td Vaccine Interim VIS (09/17/12) Document Released:  05/28/2006 Document Revised: 11/25/2012 Document Reviewed: 11/20/2012 [facility] Patient Information 2014 [place], [place].

## 2014-06-22 ENCOUNTER — Other Ambulatory Visit: Payer: Self-pay | Admitting: Gynecology

## 2014-06-22 DIAGNOSIS — Z1231 Encounter for screening mammogram for malignant neoplasm of breast: Secondary | ICD-10-CM

## 2014-07-17 ENCOUNTER — Encounter: Payer: BC Managed Care – PPO | Admitting: Gynecology

## 2014-07-20 ENCOUNTER — Ambulatory Visit (HOSPITAL_COMMUNITY)
Admission: RE | Admit: 2014-07-20 | Discharge: 2014-07-20 | Disposition: A | Discharge status: Home / Self Care | Payer: 59 | Source: Ambulatory Visit | Attending: Gynecology | Admitting: Gynecology

## 2014-07-20 DIAGNOSIS — Z1231 Encounter for screening mammogram for malignant neoplasm of breast: Secondary | ICD-10-CM | POA: Diagnosis not present

## 2014-07-22 ENCOUNTER — Ambulatory Visit (INDEPENDENT_AMBULATORY_CARE_PROVIDER_SITE_OTHER): Payer: 59 | Admitting: Gynecology

## 2014-07-22 ENCOUNTER — Encounter: Payer: Self-pay | Admitting: Gynecology

## 2014-07-22 ENCOUNTER — Other Ambulatory Visit (HOSPITAL_COMMUNITY)
Admission: RE | Admit: 2014-07-22 | Discharge: 2014-07-22 | Disposition: A | Discharge status: Home / Self Care | Payer: 59 | Source: Ambulatory Visit | Attending: Gynecology | Admitting: Gynecology

## 2014-07-22 VITALS — BP 128/76 | Ht 63.0 in | Wt 158.0 lb

## 2014-07-22 DIAGNOSIS — Z01419 Encounter for gynecological examination (general) (routine) without abnormal findings: Secondary | ICD-10-CM

## 2014-07-22 DIAGNOSIS — Z23 Encounter for immunization: Secondary | ICD-10-CM

## 2014-07-22 DIAGNOSIS — Z1151 Encounter for screening for human papillomavirus (HPV): Secondary | ICD-10-CM | POA: Diagnosis present

## 2014-07-22 DIAGNOSIS — R635 Abnormal weight gain: Secondary | ICD-10-CM

## 2014-07-22 DIAGNOSIS — K429 Umbilical hernia without obstruction or gangrene: Secondary | ICD-10-CM

## 2014-07-22 DIAGNOSIS — N951 Menopausal and female climacteric states: Secondary | ICD-10-CM

## 2014-07-22 LAB — CBC WITH DIFFERENTIAL/PLATELET
BASOS ABS: 0 10*3/uL (ref 0.0–0.1)
Basophils Relative: 0 % (ref 0–1)
EOS ABS: 0.1 10*3/uL (ref 0.0–0.7)
EOS PCT: 1 % (ref 0–5)
HEMATOCRIT: 42 % (ref 36.0–46.0)
HEMOGLOBIN: 14.4 g/dL (ref 12.0–15.0)
LYMPHS ABS: 3.6 10*3/uL (ref 0.7–4.0)
Lymphocytes Relative: 37 % (ref 12–46)
MCH: 29.2 pg (ref 26.0–34.0)
MCHC: 34.3 g/dL (ref 30.0–36.0)
MCV: 85.2 fL (ref 78.0–100.0)
MONO ABS: 1 10*3/uL (ref 0.1–1.0)
MPV: 8.9 fL — AB (ref 9.4–12.4)
Monocytes Relative: 10 % (ref 3–12)
Neutro Abs: 5 10*3/uL (ref 1.7–7.7)
Neutrophils Relative %: 52 % (ref 43–77)
PLATELETS: 348 10*3/uL (ref 150–400)
RBC: 4.93 MIL/uL (ref 3.87–5.11)
RDW: 13.2 % (ref 11.5–15.5)
WBC: 9.6 10*3/uL (ref 4.0–10.5)

## 2014-07-22 LAB — COMPREHENSIVE METABOLIC PANEL
ALBUMIN: 4.3 g/dL (ref 3.5–5.2)
ALT: 15 U/L (ref 0–35)
AST: 16 U/L (ref 0–37)
Alkaline Phosphatase: 70 U/L (ref 39–117)
BILIRUBIN TOTAL: 0.6 mg/dL (ref 0.2–1.2)
BUN: 14 mg/dL (ref 6–23)
CALCIUM: 9.3 mg/dL (ref 8.4–10.5)
CO2: 25 meq/L (ref 19–32)
CREATININE: 0.71 mg/dL (ref 0.50–1.10)
Chloride: 103 mEq/L (ref 96–112)
Glucose, Bld: 76 mg/dL (ref 70–99)
Potassium: 4.9 mEq/L (ref 3.5–5.3)
SODIUM: 140 meq/L (ref 135–145)
Total Protein: 7 g/dL (ref 6.0–8.3)

## 2014-07-22 LAB — LIPID PANEL
CHOLESTEROL: 236 mg/dL — AB (ref 0–200)
HDL: 64 mg/dL (ref >39)
LDL CALC: 153 mg/dL — AB (ref 0–99)
Total CHOL/HDL Ratio: 3.7 Ratio
Triglycerides: 96 mg/dL (ref <150)
VLDL: 19 mg/dL (ref 0–40)

## 2014-07-22 LAB — TSH: TSH: 1.735 u[IU]/mL (ref 0.350–4.500)

## 2014-07-22 NOTE — Patient Instructions (Signed)
Exercise to Lose Weight Exercise and a healthy diet may help you lose weight. Your doctor may suggest specific exercises. EXERCISE IDEAS AND TIPS  Choose low-cost things you enjoy doing, such as walking, bicycling, or exercising to workout videos.   Take stairs instead of the elevator.   Walk during your lunch break.   Park your car further away from work or school.   Go to a gym or an exercise class.   Start with 5 to 10 minutes of exercise each day. Build up to 30 minutes of exercise 4 to 6 days a week.   Wear shoes with good support and comfortable clothes.   Stretch before and after working out.   Work out until you breathe harder and your heart beats faster.   Drink extra water when you exercise.   Do not do so much that you hurt yourself, feel dizzy, or get very short of breath.  Exercises that burn about 150 calories:  Running 1  miles in 15 minutes.   Playing volleyball for 45 to 60 minutes.   Washing and waxing a car for 45 to 60 minutes.   Playing touch football for 45 minutes.   Walking 1  miles in 35 minutes.   Pushing a stroller 1  miles in 30 minutes.   Playing basketball for 30 minutes.   Raking leaves for 30 minutes.   Bicycling 5 miles in 30 minutes.   Walking 2 miles in 30 minutes.   Dancing for 30 minutes.   Shoveling snow for 15 minutes.   Swimming laps for 20 minutes.   Walking up stairs for 15 minutes.   Bicycling 4 miles in 15 minutes.   Gardening for 30 to 45 minutes.   Jumping rope for 15 minutes.   Washing windows or floors for 45 to 60 minutes.  Document Released: 09/02/2010 Document Revised: 04/12/2011 Document Reviewed: 09/02/2010 [facility] Patient Information 2012 [place], [place].Hormone Therapy At menopause, your body begins making less estrogen and progesterone hormones. This causes the body to stop having menstrual periods. This is because estrogen and progesterone hormones control your periods and menstrual cycle. A  lack of estrogen may cause symptoms such as:  Hot flushes (or hot flashes).  Vaginal dryness.  Dry skin.  Loss of sex drive.  Risk of bone loss (osteoporosis). When this happens, you may choose to take hormone therapy to get back the estrogen lost during menopause. When the hormone estrogen is given alone, it is usually referred to as ET (Estrogen Therapy). When the hormone progestin is combined with estrogen, it is generally called HT (Hormone Therapy). This was formerly known as hormone replacement therapy (HRT). Your caregiver can help you make a decision on what will be best for you. The decision to use HT seems to change often as new studies are done. Many studies do not agree on the benefits of hormone replacement therapy. LIKELY BENEFITS OF HT INCLUDE PROTECTION FROM:  Hot Flushes (also called hot flashes) - A hot flush is a sudden feeling of heat that spreads over the face and body. The skin may redden like a blush. It is connected with sweats and sleep disturbance. Women going through menopause may have hot flushes a few times a month or several times per day depending on the woman.  Osteoporosis (bone loss)- Estrogen helps guard against bone loss. After menopause, a woman's bones slowly lose calcium and become weak and brittle. As a result, bones are more likely to break. The hip, wrist,  and spine are affected most often. Hormone therapy can help slow bone loss after menopause. Weight bearing exercise and taking calcium with vitamin D also can help prevent bone loss. There are also medications that your caregiver can prescribe that can help prevent osteoporosis.  Vaginal Dryness - Loss of estrogen causes changes in the vagina. Its lining may become thin and dry. These changes can cause pain and bleeding during sexual intercourse. Dryness can also lead to infections. This can cause burning and itching. (Vaginal estrogen treatment can help relieve pain, itching, and dryness.)  Urinary  Tract Infections are more common after menopause because of lack of estrogen. Some women also develop urinary incontinence because of low estrogen levels in the vagina and bladder.  Possible other benefits of estrogen include a positive effect on mood and short-term memory in women. RISKS AND COMPLICATIONS  Using estrogen alone without progesterone causes the lining of the uterus to grow. This increases the risk of lining of the uterus (endometrial) cancer. Your caregiver should give another hormone called progestin if you have a uterus.  Women who take combined (estrogen and progestin) HT appear to have an increased risk of breast cancer. The risk appears to be small, but increases throughout the time that HT is taken.  Combined therapy also makes the breast tissue slightly denser which makes it harder to read mammograms (breast X-rays).  Combined, estrogen and progesterone therapy can be taken together every day, in which case there may be spotting of blood. HT therapy can be taken cyclically in which case you will have menstrual periods. Cyclically means HT is taken for a set amount of days, then not taken, then this process is repeated.  HT may increase the risk of stroke, heart attack, breast cancer and forming blood clots in your leg.  Transdermal estrogen (estrogen that is absorbed through the skin with a patch or a cream) may have more positive results with:  Cholesterol.  Blood pressure.  Blood clots. Having the following conditions may indicate you should not have HT:  Endometrial cancer.  Liver disease.  Breast cancer.  Heart disease.  History of blood clots.  Stroke. TREATMENT   If you choose to take HT and have a uterus, usually estrogen and progestin are prescribed.  Your caregiver will help you decide the best way to take the medications.  Possible ways to take estrogen include:  Pills.  Patches.  Gels.  Sprays.  Vaginal estrogen cream, rings and  tablets.  It is best to take the lowest dose possible that will help your symptoms and take them for the shortest period of time that you can.  Hormone therapy can help relieve some of the problems (symptoms) that affect women at menopause. Before making a decision about HT, talk to your caregiver about what is best for you. Be well informed and comfortable with your decisions. HOME CARE INSTRUCTIONS   Follow your caregivers advice when taking the medications.  A Pap test is done to screen for cervical cancer.  The first Pap test should be done at age 65.  Between ages 70 and 44, Pap tests are repeated every 2 years.  Beginning at age 17, you are advised to have a Pap test every 3 years as long as your past 3 Pap tests have been normal.  Some women have medical problems that increase the chance of getting cervical cancer. Talk to your caregiver about these problems. It is especially important to talk to your caregiver if a new  problem develops soon after your last Pap test. In these cases, your caregiver may recommend more frequent screening and Pap tests.  The above recommendations are the same for women who have or have not gotten the vaccine for HPV (Human Papillomavirus).  If you had a hysterectomy for a problem that was not a cancer or a condition that could lead to cancer, then you no longer need Pap tests. However, even if you no longer need a Pap test, a regular exam is a good idea to make sure no other problems are starting.   If you are between ages 10465 and 6170, and you have had normal Pap tests going back 10 years, you no longer need Pap tests. However, even if you no longer need a Pap test, a regular exam is a good idea to make sure no other problems are starting.   If you have had past treatment for cervical cancer or a condition that could lead to cancer, you need Pap tests and screening for cancer for at least 20 years after your treatment.  If Pap tests have been  discontinued, risk factors (such as a new sexual partner) need to be re-assessed to determine if screening should be resumed.  Some women may need screenings more often if they are at high risk for cervical cancer.  Get mammograms done as per the advice of your caregiver. SEEK IMMEDIATE MEDICAL CARE IF:  You develop abnormal vaginal bleeding.  You have pain or swelling in your legs, shortness of breath, or chest pain.  You develop dizziness or headaches.  You have lumps or changes in your breasts or armpits.  You have slurred speech.  You develop weakness or numbness of your arms or legs.  You have pain, burning, or bleeding when urinating.  You develop abdominal pain. Document Released: 04/29/2003 Document Revised: 10/23/2011 Document Reviewed: 08/17/2010 [facility] Patient Information 2015 [place], [place]. This information is not intended to replace advice given to you by your health care provider. Make sure you discuss any questions you have with your health care provider. Perimenopause Perimenopause is the time when your body begins to move into the menopause (no menstrual period for 12 straight months). It is a natural process. Perimenopause can begin 2-8 years before the menopause and usually lasts for 1 year after the menopause. During this time, your ovaries may or may not produce an egg. The ovaries vary in their production of estrogen and progesterone hormones each month. This can cause irregular menstrual periods, difficulty getting pregnant, vaginal bleeding between periods, and uncomfortable symptoms. CAUSES  Irregular production of the ovarian hormones, estrogen and progesterone, and not ovulating every month.  Other causes include: 1. Tumor of the pituitary gland in the brain. 2. Medical disease that affects the ovaries. 3. Radiation treatment. 4. Chemotherapy. 5. Unknown causes. 6. Heavy smoking and excessive alcohol intake can bring on perimenopause sooner. SIGNS  AND SYMPTOMS   Hot flashes.  Night sweats.  Irregular menstrual periods.  Decreased sex drive.  Vaginal dryness.  Headaches.  Mood swings.  Depression.  Memory problems.  Irritability.  Tiredness.  Weight gain.  Trouble getting pregnant.  The beginning of losing bone cells (osteoporosis).  The beginning of hardening of the arteries (atherosclerosis). DIAGNOSIS  Your health care provider will make a diagnosis by analyzing your age, menstrual history, and symptoms. He or she will do a physical exam and note any changes in your body, especially your female organs. Female hormone tests may or may not be  helpful depending on the amount of female hormones you produce and when you produce them. However, other hormone tests may be helpful to rule out other problems. TREATMENT  In some cases, no treatment is needed. The decision on whether treatment is necessary during the perimenopause should be made by you and your health care provider based on how the symptoms are affecting you and your lifestyle. Various treatments are available, such as:  Treating individual symptoms with a specific medicine for that symptom.  Herbal medicines that can help specific symptoms.  Counseling.  Group therapy. HOME CARE INSTRUCTIONS   Keep track of your menstrual periods (when they occur, how heavy they are, how long between periods, and how long they last) as well as your symptoms and when they started.  Only take over-the-counter or prescription medicines as directed by your health care provider.  Sleep and rest.  Exercise.  Eat a diet that contains calcium (good for your bones) and soy (acts like the estrogen hormone).  Do not smoke.  Avoid alcoholic beverages.  Take vitamin supplements as recommended by your health care provider. Taking vitamin E may help in certain cases.  Take calcium and vitamin D supplements to help prevent bone loss.  Group therapy is sometimes  helpful.  Acupuncture may help in some cases. SEEK MEDICAL CARE IF:   You have questions about any symptoms you are having.  You need a referral to a specialist (gynecologist, psychiatrist, or psychologist). SEEK IMMEDIATE MEDICAL CARE IF:   You have vaginal bleeding.  Your period lasts longer than 8 days.  Your periods are recurring sooner than 21 days.  You have bleeding after intercourse.  You have severe depression.  You have pain when you urinate.  You have severe headaches.  You have vision problems. Document Released: 09/07/2004 Document Revised: 05/21/2013 Document Reviewed: 02/27/2013 [facility] Patient Information 2015 [place], [place]. This information is not intended to replace advice given to you by your health care provider. Make sure you discuss any questions you have with your health care provider. Influenza Virus Vaccine injection (Fluarix) What is this medicine? INFLUENZA VIRUS VACCINE (in floo EN zuh VAHY ruhs vak SEEN) helps to reduce the risk of getting influenza also known as the flu. This medicine may be used for other purposes; ask your health care provider or pharmacist if you have questions. COMMON BRAND NAME(S): Fluarix, Fluzone What should I tell my health care provider before I take this medicine? They need to know if you have any of these conditions: -bleeding disorder like hemophilia -fever or infection -Guillain-Barre syndrome or other neurological problems -immune system problems -infection with the human immunodeficiency virus (HIV) or AIDS -low blood platelet counts -multiple sclerosis -an unusual or allergic reaction to influenza virus vaccine, eggs, chicken proteins, latex, gentamicin, other medicines, foods, dyes or preservatives -pregnant or trying to get pregnant -breast-feeding How should I use this medicine? This vaccine is for injection into a muscle. It is given by a health care professional. A copy of Vaccine Information  Statements will be given before each vaccination. Read this sheet carefully each time. The sheet may change frequently. Talk to your pediatrician regarding the use of this medicine in children. Special care may be needed. Overdosage: If you think you have taken too much of this medicine contact a poison control center or emergency room at once. NOTE: This medicine is only for you. Do not share this medicine with others. What if I miss a dose? This does not apply. What  may interact with this medicine? -chemotherapy or radiation therapy -medicines that lower your immune system like etanercept, anakinra, infliximab, and adalimumab -medicines that treat or prevent blood clots like warfarin -phenytoin -steroid medicines like prednisone or cortisone -theophylline -vaccines This list may not describe all possible interactions. Give your health care provider a list of all the medicines, herbs, non-prescription drugs, or dietary supplements you use. Also tell them if you smoke, drink alcohol, or use illegal drugs. Some items may interact with your medicine. What should I watch for while using this medicine? Report any side effects that do not go away within 3 days to your doctor or health care professional. Call your health care provider if any unusual symptoms occur within 6 weeks of receiving this vaccine. You may still catch the flu, but the illness is not usually as bad. You cannot get the flu from the vaccine. The vaccine will not protect against colds or other illnesses that may cause fever. The vaccine is needed every year. What side effects may I notice from receiving this medicine? Side effects that you should report to your doctor or health care professional as soon as possible: -allergic reactions like skin rash, itching or hives, swelling of the face, lips, or tongue Side effects that usually do not require medical attention (report to your doctor or health care professional if they continue  or are bothersome): -fever -headache -muscle aches and pains -pain, tenderness, redness, or swelling at site where injected -weak or tired This list may not describe all possible side effects. Call your doctor for medical advice about side effects. You may report side effects to FDA at 1-800-FDA-1088. Where should I keep my medicine? This vaccine is only given in a clinic, pharmacy, doctor's office, or other health care setting and will not be stored at home. NOTE: This sheet is a summary. It may not cover all possible information. If you have questions about this medicine, talk to your doctor, pharmacist, or health care provider.  2015, [employer] Standard. (2008-02-26 09:30:40)

## 2014-07-22 NOTE — Progress Notes (Signed)
Candice SjogrenBarbara Lee 1961/01/24 [id number]   History:    53 y.o.  for annual gyn exam with no major complaints today. Patient had a menstrual cycle in 2014 in the last one in 2015. [facility] Patient currently having no vasomotor symptoms. Patient has 2 adopted children. Patient 2014 was screened for hepatitis C and was negative. She had a normal colonoscopy in 2014. Patient with no past history of any abnormal Pap smear. And her mammogram this year three-dimensional as a result of dense breasts was normal. Patient requesting her flu vaccine today.  Past medical history,surgical history, family history and social history were all reviewed and documented in the EPIC chart.  Gynecologic History No LMP recorded. Patient is not currently having periods (Reason: Perimenopausal). Contraception: post menopausal status Last Pap: 2012. Results were: normal Last mammogram: 2015. Results were: normal  Obstetric History OB History  Gravida Para Term Preterm AB SAB TAB Ectopic Multiple Living  0                  ROS: A ROS was performed and pertinent positives and negatives are included in the history.  GENERAL: No fevers or chills. HEENT: No change in vision, no earache, sore throat or sinus congestion. NECK: No pain or stiffness. CARDIOVASCULAR: No chest pain or pressure. No palpitations. PULMONARY: No shortness of breath, cough or wheeze. GASTROINTESTINAL: No abdominal pain, nausea, vomiting or diarrhea, melena or bright red blood per rectum. GENITOURINARY: No urinary frequency, urgency, hesitancy or dysuria. MUSCULOSKELETAL: No joint or muscle pain, no back pain, no recent trauma. DERMATOLOGIC: No rash, no itching, no lesions. ENDOCRINE: No polyuria, polydipsia, no heat or cold intolerance. No recent change in weight. HEMATOLOGICAL: No anemia or easy bruising or bleeding. NEUROLOGIC: No headache, seizures, numbness, tingling or weakness. PSYCHIATRIC: No  depression, no loss of interest in normal activity or change in sleep pattern.     Exam: chaperone present  BP 128/76 mmHg  Ht 5\' 3"  (1.6 m)  Wt 158 lb (71.668 kg)  BMI 28.00 kg/m2  Body mass index is 28 kg/(m^2).  General appearance : Well developed well nourished female. No acute distress HEENT: Neck supple, trachea midline, no carotid bruits, no thyroidmegaly Lungs: Clear to auscultation, no rhonchi or wheezes, or rib retractions  Heart: Regular rate and rhythm, no murmurs or gallops Breast:Examined in sitting and supine position were symmetrical in appearance, no palpable masses or tenderness,  no skin retraction, no nipple inversion, no nipple discharge, no skin discoloration, no axillary or supraclavicular lymphadenopathy Abdomen: no palpable masses or tenderness, no rebound or guarding Extremities: no edema or skin discoloration or tenderness  Pelvic:  Bartholin, Urethra, Skene Glands: Within normal limits             Vagina: No gross lesions or discharge  Cervix: No gross lesions or discharge  Uterus  anteverted, normal size, shape and consistency, non-tender and mobile  Adnexa  Without masses or tenderness  Anus and perineum  normal   Rectovaginal  normal sphincter tone without palpated masses or tenderness             Hemoccult cards provided     Assessment/Plan:  53 y.o. female for annual exam perimenopausal we'll obtain FSH today to compare with 2014. Patient with no vasomotor symptoms. Literature information on the perimenopause as well as on hormone replacement therapy was provided. Patient received the flu vaccine today. Patient was reminded to do her monthly breast  exam. We discussed importance of calcium vitamin D and regular exercise for osteoporosis prevention. The following labs ordered today: CBC, fasting lipid profile, comprehensive metabolic panel, TSH, urinalysis and Pap smear.   Candice EdwardsFERNANDEZ,Candice Sarafian H MD, 11:31 AM 07/22/2014

## 2014-07-23 ENCOUNTER — Other Ambulatory Visit: Payer: Self-pay | Admitting: Gynecology

## 2014-07-23 DIAGNOSIS — E7800 Pure hypercholesterolemia, unspecified: Secondary | ICD-10-CM

## 2014-07-23 LAB — URINALYSIS W MICROSCOPIC + REFLEX CULTURE
Bacteria, UA: NONE SEEN
Bilirubin Urine: NEGATIVE
Casts: NONE SEEN
GLUCOSE, UA: NEGATIVE mg/dL
KETONES UR: NEGATIVE mg/dL
Leukocytes, UA: NEGATIVE
Nitrite: NEGATIVE
PH: 5 (ref 5.0–8.0)
Protein, ur: NEGATIVE mg/dL
SPECIFIC GRAVITY, URINE: 1.024 (ref 1.005–1.030)
Squamous Epithelial / LPF: NONE SEEN
UROBILINOGEN UA: 0.2 mg/dL (ref 0.0–1.0)

## 2014-07-23 LAB — CYTOLOGY - PAP

## 2014-07-23 LAB — FOLLICLE STIMULATING HORMONE: FSH: 33.7 m[IU]/mL

## 2014-07-24 LAB — URINE CULTURE

## 2015-06-15 ENCOUNTER — Other Ambulatory Visit: Payer: Self-pay

## 2015-06-15 DIAGNOSIS — Z1231 Encounter for screening mammogram for malignant neoplasm of breast: Secondary | ICD-10-CM

## 2015-07-22 ENCOUNTER — Ambulatory Visit
Admission: RE | Admit: 2015-07-22 | Discharge: 2015-07-22 | Disposition: A | Discharge status: Home / Self Care | Payer: Managed Care, Other (non HMO) | Source: Ambulatory Visit

## 2015-07-22 DIAGNOSIS — Z1231 Encounter for screening mammogram for malignant neoplasm of breast: Secondary | ICD-10-CM

## 2015-07-26 ENCOUNTER — Ambulatory Visit (INDEPENDENT_AMBULATORY_CARE_PROVIDER_SITE_OTHER): Payer: Managed Care, Other (non HMO) | Admitting: Gynecology

## 2015-07-26 ENCOUNTER — Encounter: Payer: Self-pay | Admitting: Gynecology

## 2015-07-26 VITALS — BP 124/78 | Ht 63.0 in | Wt 166.0 lb

## 2015-07-26 DIAGNOSIS — Z01419 Encounter for gynecological examination (general) (routine) without abnormal findings: Secondary | ICD-10-CM | POA: Diagnosis not present

## 2015-07-26 DIAGNOSIS — Z23 Encounter for immunization: Secondary | ICD-10-CM

## 2015-07-26 DIAGNOSIS — Z780 Asymptomatic menopausal state: Secondary | ICD-10-CM | POA: Diagnosis not present

## 2015-07-26 LAB — COMPREHENSIVE METABOLIC PANEL
ALBUMIN: 3.8 g/dL (ref 3.6–5.1)
ALT: 16 U/L (ref 6–29)
AST: 14 U/L (ref 10–35)
Alkaline Phosphatase: 76 U/L (ref 33–130)
BUN: 13 mg/dL (ref 7–25)
CALCIUM: 9.3 mg/dL (ref 8.6–10.4)
CO2: 25 mmol/L (ref 20–31)
Chloride: 104 mmol/L (ref 98–110)
Creat: 0.62 mg/dL (ref 0.50–1.05)
Glucose, Bld: 87 mg/dL (ref 65–99)
Potassium: 4.1 mmol/L (ref 3.5–5.3)
Sodium: 139 mmol/L (ref 135–146)
TOTAL PROTEIN: 6.6 g/dL (ref 6.1–8.1)
Total Bilirubin: 0.3 mg/dL (ref 0.2–1.2)

## 2015-07-26 LAB — LIPID PANEL
Cholesterol: 211 mg/dL — ABNORMAL HIGH (ref 125–200)
HDL: 51 mg/dL (ref >=46)
LDL CALC: 125 mg/dL (ref <130)
TRIGLYCERIDES: 173 mg/dL — AB (ref <150)
Total CHOL/HDL Ratio: 4.1 Ratio (ref <=5.0)
VLDL: 35 mg/dL — ABNORMAL HIGH (ref <30)

## 2015-07-26 LAB — CBC WITH DIFFERENTIAL/PLATELET
Basophils Absolute: 0 10*3/uL (ref 0.0–0.1)
Basophils Relative: 0 % (ref 0–1)
Eosinophils Absolute: 0.2 10*3/uL (ref 0.0–0.7)
Eosinophils Relative: 2 % (ref 0–5)
HEMATOCRIT: 43.1 % (ref 36.0–46.0)
HEMOGLOBIN: 14.3 g/dL (ref 12.0–15.0)
LYMPHS PCT: 40 % (ref 12–46)
Lymphs Abs: 3.1 10*3/uL (ref 0.7–4.0)
MCH: 28.9 pg (ref 26.0–34.0)
MCHC: 33.2 g/dL (ref 30.0–36.0)
MCV: 87.2 fL (ref 78.0–100.0)
MONOS PCT: 9 % (ref 3–12)
MPV: 8.8 fL (ref 8.6–12.4)
Monocytes Absolute: 0.7 10*3/uL (ref 0.1–1.0)
NEUTROS ABS: 3.8 10*3/uL (ref 1.7–7.7)
Neutrophils Relative %: 49 % (ref 43–77)
Platelets: 361 10*3/uL (ref 150–400)
RBC: 4.94 MIL/uL (ref 3.87–5.11)
RDW: 13.6 % (ref 11.5–15.5)
WBC: 7.7 10*3/uL (ref 4.0–10.5)

## 2015-07-26 LAB — TSH: TSH: 0.961 u[IU]/mL (ref 0.350–4.500)

## 2015-07-26 NOTE — Patient Instructions (Signed)
Hormone Therapy At menopause, your body begins making less estrogen and progesterone hormones. This causes the body to stop having menstrual periods. This is because estrogen and progesterone hormones control your periods and menstrual cycle. A lack of estrogen may cause symptoms such as:  Hot flushes (or hot flashes).  Vaginal dryness.  Dry skin.  Loss of sex drive.  Risk of bone loss (osteoporosis). When this happens, you may choose to take hormone therapy to get back the estrogen lost during menopause. When the hormone estrogen is given alone, it is usually referred to as ET (Estrogen Therapy). When the hormone progestin is combined with estrogen, it is generally called HT (Hormone Therapy). This was formerly known as hormone replacement therapy (HRT). Your caregiver can help you make a decision on what will be best for you. The decision to use HT seems to change often as new studies are done. Many studies do not agree on the benefits of hormone replacement therapy. LIKELY BENEFITS OF HT INCLUDE PROTECTION FROM:  Hot Flushes (also called hot flashes) - A hot flush is a sudden feeling of heat that spreads over the face and body. The skin may redden like a blush. It is connected with sweats and sleep disturbance. Women going through menopause may have hot flushes a few times a month or several times per day depending on the woman.  Osteoporosis (bone loss) - Estrogen helps guard against bone loss. After menopause, a woman's bones slowly lose calcium and become weak and brittle. As a result, bones are more likely to break. The hip, wrist, and spine are affected most often. Hormone therapy can help slow bone loss after menopause. Weight bearing exercise and taking calcium with vitamin D also can help prevent bone loss. There are also medications that your caregiver can prescribe that can help prevent osteoporosis.  Vaginal dryness - Loss of estrogen causes changes in the vagina. Its lining may  become thin and dry. These changes can cause pain and bleeding during sexual intercourse. Dryness can also lead to infections. This can cause burning and itching. (Vaginal estrogen treatment can help relieve pain, itching, and dryness.)  Urinary tract infections are more common after menopause because of lack of estrogen. Some women also develop urinary incontinence because of low estrogen levels in the vagina and bladder.  Possible other benefits of estrogen include a positive effect on mood and short-term memory in women. RISKS AND COMPLICATIONS  Using estrogen alone without progesterone causes the lining of the uterus to grow. This increases the risk of lining of the uterus (endometrial) cancer. Your caregiver should give another hormone called progestin if you have a uterus.  Women who take combined (estrogen and progestin) HT appear to have an increased risk of breast cancer. The risk appears to be small, but increases throughout the time that HT is taken.  Combined therapy also makes the breast tissue slightly denser which makes it harder to read mammograms (breast X-rays).  Combined, estrogen and progesterone therapy can be taken together every day, in which case there may be spotting of blood. HT therapy can be taken cyclically in which case you will have menstrual periods. Cyclically means HT is taken for a set amount of days, then not taken, then this process is repeated.  HT may increase the risk of stroke, heart attack, breast cancer and forming blood clots in your leg.  Transdermal estrogen (estrogen that is absorbed through the skin with a patch or a cream) may have better results with:    Cholesterol.  Blood pressure.  Blood clots. Having the following conditions may indicate you should not have HT:  Endometrial cancer.  Liver disease.  Breast cancer.  Heart disease.  History of blood clots.  Stroke. TREATMENT   If you choose to take HT and have a uterus, usually  estrogen and progestin are prescribed.  Your caregiver will help you decide the best way to take the medications.  Possible ways to take estrogen include:  Pills.  Patches.  Gels.  Sprays.  Vaginal estrogen cream, rings and tablets.  It is best to take the lowest dose possible that will help your symptoms and take them for the shortest period of time that you can.  Hormone therapy can help relieve some of the problems (symptoms) that affect women at menopause. Before making a decision about HT, talk to your caregiver about what is best for you. Be well informed and comfortable with your decisions. HOME CARE INSTRUCTIONS   Follow your caregivers advice when taking the medications.  A Pap test is done to screen for cervical cancer.  The first Pap test should be done at age 21.  Between ages 21 and 29, Pap tests are repeated every 2 years.  Beginning at age 30, you are advised to have a Pap test every 3 years as long as the past 3 Pap tests have been normal.  Some women have medical problems that increase the chance of getting cervical cancer. Talk to your caregiver about these problems. It is especially important to talk to your caregiver if a new problem develops soon after your last Pap test. In these cases, your caregiver may recommend more frequent screening and Pap tests.  The above recommendations are the same for women who have or have not gotten the vaccine for HPV (human papillomavirus).  If you had a hysterectomy for a problem that was not a cancer or a condition that could lead to cancer, then you no longer need Pap tests. However, even if you no longer need a Pap test, a regular exam is a good idea to make sure no other problems are starting.  If you are between ages 65 and 70, and you have had normal Pap tests going back 10 years, you no longer need Pap tests. However, even if you no longer need a Pap test, a regular exam is a good idea to make sure no other problems  are starting.  If you have had past treatment for cervical cancer or a condition that could lead to cancer, you need Pap tests and screening for cancer for at least 20 years after your treatment.  If Pap tests have been discontinued, risk factors (such as a new sexual partner)need to be re-assessed to determine if screening should be resumed.  Some women may need screenings more often if they are at high risk for cervical cancer.  Get mammograms done as per the advice of your caregiver. SEEK IMMEDIATE MEDICAL CARE IF:  You develop abnormal vaginal bleeding.  You have pain or swelling in your legs, shortness of breath, or chest pain.  You develop dizziness or headaches.  You have lumps or changes in your breasts or armpits.  You have slurred speech.  You develop weakness or numbness of your arms or legs.  You have pain, burning, or bleeding when urinating.  You develop abdominal pain.   This information is not intended to replace advice given to you by your health care Frady Taddeo. Make sure you discuss any questions   you have with your health care Crespin Forstrom.   Document Released: 04/29/2003 Document Revised: 12/15/2014 Document Reviewed: 02/01/2015 Elsevier Interactive Patient Education 2016 [employer] Menopause Menopause is the normal time of life when menstrual periods stop completely. Menopause is complete when you have missed 12 consecutive menstrual periods. It usually occurs between the ages of 48 years and 55 years. Very rarely does a woman develop menopause before the age of 40 years. At menopause, your ovaries stop producing the female hormones estrogen and progesterone. This can cause undesirable symptoms and also affect your health. Sometimes the symptoms may occur 4-5 years before the menopause begins. There is no relationship between menopause and:  Oral contraceptives.  Number of children you had.  Race.  The age your menstrual periods started (menarche). Heavy  smokers and very thin women may develop menopause earlier in life. CAUSES  The ovaries stop producing the female hormones estrogen and progesterone.  Other causes include:  Surgery to remove both ovaries.  The ovaries stop functioning for no known reason.  Tumors of the pituitary gland in the brain.  Medical disease that affects the ovaries and hormone production.  Radiation treatment to the abdomen or pelvis.  Chemotherapy that affects the ovaries. SYMPTOMS   Hot flashes.  Night sweats.  Decrease in sex drive.  Vaginal dryness and thinning of the vagina causing painful intercourse.  Dryness of the skin and developing wrinkles.  Headaches.  Tiredness.  Irritability.  Memory problems.  Weight gain.  Bladder infections.  Hair growth of the face and chest.  Infertility. More serious symptoms include:  Loss of bone (osteoporosis) causing breaks (fractures).  Depression.  Hardening and narrowing of the arteries (atherosclerosis) causing heart attacks and strokes. DIAGNOSIS   When the menstrual periods have stopped for 12 straight months.  Physical exam.  Hormone studies of the blood. TREATMENT  There are many treatment choices and nearly as many questions about them. The decisions to treat or not to treat menopausal changes is an individual choice made with your health care Adi Doro. Your health care Delayni Streed can discuss the treatments with you. Together, you can decide which treatment will work best for you. Your treatment choices may include:   Hormone therapy (estrogen and progesterone).  Non-hormonal medicines.  Treating the individual symptoms with medicine (for example antidepressants for depression).  Herbal medicines that may help specific symptoms.  Counseling by a psychiatrist or psychologist.  Group therapy.  Lifestyle changes including:  Eating healthy.  Regular exercise.  Limiting caffeine and alcohol.  Stress management and  meditation.  No treatment. HOME CARE INSTRUCTIONS   Take the medicine your health care Katheryn Culliton gives you as directed.  Get plenty of sleep and rest.  Exercise regularly.  Eat a diet that contains calcium (good for the bones) and soy products (acts like estrogen hormone).  Avoid alcoholic beverages.  Do not smoke.  If you have hot flashes, dress in layers.  Take supplements, calcium, and vitamin D to strengthen bones.  You can use over-the-counter lubricants or moisturizers for vaginal dryness.  Group therapy is sometimes very helpful.  Acupuncture may be helpful in some cases. SEEK MEDICAL CARE IF:   You are not sure you are in menopause.  You are having menopausal symptoms and need advice and treatment.  You are still having menstrual periods after age 55 years.  You have pain with intercourse.  Menopause is complete (no menstrual period for 12 months) and you develop vaginal bleeding.  You need a referral to   a specialist (gynecologist, psychiatrist, or psychologist) for treatment. SEEK IMMEDIATE MEDICAL CARE IF:   You have severe depression.  You have excessive vaginal bleeding.  You fell and think you have a broken bone.  You have pain when you urinate.  You develop leg or chest pain.  You have a fast pounding heart beat (palpitations).  You have severe headaches.  You develop vision problems.  You feel a lump in your breast.  You have abdominal pain or severe indigestion.   This information is not intended to replace advice given to you by your health care Michala Deblanc. Make sure you discuss any questions you have with your health care Jakhi Dishman.   Document Released: 10/21/2003 Document Revised: 04/02/2013 Document Reviewed: 02/27/2013 Elsevier Interactive Patient Education 2016 Elsevier Inc.  

## 2015-07-26 NOTE — Addendum Note (Signed)
Addended by: Berna SpareASTILLO, Aneka Fagerstrom A on: 07/26/2015 11:29 AM   Modules accepted: Orders, SmartSet

## 2015-07-26 NOTE — Progress Notes (Signed)
Candice Lee [birth year] [id number]   History:    54 y.o.  for annual gyn exam with no complaints today. Patient reports her last menstrual cycle was in June of this year. She has very mild if any vasomotor symptoms. Patient in 2014 had an [facility] was 11.4. Patient has 2 adopted children. Patient 2014 was screened for hepatitis C and was negative. She had a normal colonoscopy in 2014. Patient with no past history of any abnormal Pap smear. And her mammogram this year three-dimensional as a result of dense breasts was normal. Patient requesting her flu vaccine today.  Review of her record indicated that her lipid profile last year indicated that her LDL was elevated at 153 and her total cholesterol was 236.  Past medical history,surgical history, family history and social history were all reviewed and documented in the EPIC chart.  Gynecologic History No LMP recorded. Patient is not currently having periods (Reason: Perimenopausal). Contraception: post menopausal status Last Pap: 2015. Results were: normal Last mammogram: 2016. Results were: normal  Obstetric History OB History  Gravida Para Term Preterm AB SAB TAB Ectopic Multiple Living  0                  ROS: A ROS was performed and pertinent positives and negatives are included in the history.  GENERAL: No fevers or chills. HEENT: No change in vision, no earache, sore throat or sinus congestion. NECK: No pain or stiffness. CARDIOVASCULAR: No chest pain or pressure. No palpitations. PULMONARY: No shortness of breath, cough or wheeze. GASTROINTESTINAL: No abdominal pain, nausea, vomiting or diarrhea, melena or bright red blood per rectum. GENITOURINARY: No urinary frequency, urgency, hesitancy or dysuria. MUSCULOSKELETAL: No joint or muscle pain, no back pain, no recent trauma. DERMATOLOGIC: No rash, no itching, no lesions. ENDOCRINE: No polyuria, polydipsia, no heat or cold intolerance. No recent change in weight. HEMATOLOGICAL: No  anemia or easy bruising or bleeding. NEUROLOGIC: No headache, seizures, numbness, tingling or weakness. PSYCHIATRIC: No depression, no loss of interest in normal activity or change in sleep pattern.     Exam: chaperone present  BP 124/78 mmHg  Ht  (1.6 m)  Wt 166 lb (75.297 kg)  BMI 29.41 kg/m2  Body mass index is 29.41 kg/(m^2).  General appearance : Well developed well nourished female. No acute distress HEENT: Eyes: no retinal hemorrhage or exudates,  Neck supple, trachea midline, no carotid bruits, no thyroidmegaly Lungs: Clear to auscultation, no rhonchi or wheezes, or rib retractions  Heart: Regular rate and rhythm, no murmurs or gallops Breast:Examined in sitting and supine position were symmetrical in appearance, no palpable masses or tenderness,  no skin retraction, no nipple inversion, no nipple discharge, no skin discoloration, no axillary or supraclavicular lymphadenopathy Abdomen: no palpable masses or tenderness, no rebound or guarding Extremities: no edema or skin discoloration or tenderness  Pelvic:  Bartholin, Urethra, Skene Glands: Within normal limits             Vagina: No gross lesions or discharge  Cervix: No gross lesions or discharge  Uterus  anteverted, normal size, shape and consistency, non-tender and mobile  Adnexa  Without masses or tenderness  Anus and perineum  normal   Rectovaginal  normal sphincter tone without palpated masses or tenderness             Hemoccult cards will be provided     Assessment/Plan:  54 y.o. female for annual exam who appears to be possibly in menopause the fact  her last menstrual cycle was in June 2016. Minimal vasomotor symptoms. We'll obtain a baseline FSH. I will provide her with literature information on the menopause as well as on hormone replacement therapy in the event she becomes symptomatic and affecting her quality-of-life we'll need to sit down for discussion for consideration of possible hormone replacement  therapy. Since she is fasting today the following blood work was ordered today: Comprehensive metabolic panel, fasting lipid profile, CBC, TSH, and urinalysis. Pap smear not indicated this year. Discussed importance of calcium vitamin D and regular exercise for osteoporosis prevention. We discussed importance of monthly breast exams. Patient received the flu vaccine today.   Ok EdwardsFERNANDEZ,Iasiah Ozment H MD, 11:21 AM 07/26/2015

## 2015-07-27 ENCOUNTER — Other Ambulatory Visit: Payer: Self-pay | Admitting: Gynecology

## 2015-07-27 DIAGNOSIS — E782 Mixed hyperlipidemia: Secondary | ICD-10-CM

## 2015-07-27 LAB — URINALYSIS W MICROSCOPIC + REFLEX CULTURE
Bacteria, UA: NONE SEEN [HPF]
Bilirubin Urine: NEGATIVE
CASTS: NONE SEEN [LPF]
Crystals: NONE SEEN [HPF]
Glucose, UA: NEGATIVE
Ketones, ur: NEGATIVE
NITRITE: NEGATIVE
PH: 5 (ref 5.0–8.0)
Protein, ur: NEGATIVE
Specific Gravity, Urine: 1.022 (ref 1.001–1.035)
YEAST: NONE SEEN [HPF]

## 2015-07-27 LAB — FOLLICLE STIMULATING HORMONE: FSH: 70.9 m[IU]/mL

## 2015-07-28 LAB — URINE CULTURE
Colony Count: NO GROWTH
Organism ID, Bacteria: NO GROWTH

## 2016-01-26 ENCOUNTER — Other Ambulatory Visit: Payer: Managed Care, Other (non HMO)

## 2016-01-26 DIAGNOSIS — E782 Mixed hyperlipidemia: Secondary | ICD-10-CM

## 2016-01-27 LAB — LIPID PANEL
CHOLESTEROL: 269 mg/dL — AB (ref 125–200)
HDL: 60 mg/dL (ref >=46)
LDL CALC: 172 mg/dL — AB (ref <130)
TRIGLYCERIDES: 187 mg/dL — AB (ref <150)
Total CHOL/HDL Ratio: 4.5 Ratio (ref <=5.0)
VLDL: 37 mg/dL — ABNORMAL HIGH (ref <30)

## 2016-07-04 ENCOUNTER — Other Ambulatory Visit: Payer: Self-pay | Admitting: Gynecology

## 2016-07-04 DIAGNOSIS — Z1231 Encounter for screening mammogram for malignant neoplasm of breast: Secondary | ICD-10-CM

## 2016-07-26 ENCOUNTER — Encounter: Payer: Self-pay | Admitting: Gynecology

## 2016-07-26 ENCOUNTER — Ambulatory Visit (INDEPENDENT_AMBULATORY_CARE_PROVIDER_SITE_OTHER): Payer: Managed Care, Other (non HMO) | Admitting: Gynecology

## 2016-07-26 VITALS — BP 132/80 | Ht 63.0 in | Wt 172.0 lb

## 2016-07-26 DIAGNOSIS — N941 Unspecified dyspareunia: Secondary | ICD-10-CM

## 2016-07-26 DIAGNOSIS — Z01411 Encounter for gynecological examination (general) (routine) with abnormal findings: Secondary | ICD-10-CM

## 2016-07-26 DIAGNOSIS — Z780 Asymptomatic menopausal state: Secondary | ICD-10-CM

## 2016-07-26 DIAGNOSIS — N952 Postmenopausal atrophic vaginitis: Secondary | ICD-10-CM

## 2016-07-26 MED ORDER — ESTRADIOL 10 MCG VA TABS: 1.0000 | ORAL_TABLET | VAGINAL | 11 refills | Status: DC

## 2016-07-26 NOTE — Progress Notes (Signed)
Candice Lee [birth year] [id number]   History:    55 y.o.  for annual gyn exam who is menopausal but the only complaint is vaginal dryness and dyspareunia. She has no vasomotor symptoms. She has not had a menstrual cycle in over year [facility] 70.9 in the menopausal range. Patient with no past history of any abnormal Pap smear. Patient's PCP has been doing her blood work and her vaccines are up-to-date. Patient had a normal colonoscopy in 2014  Past medical history,surgical history, family history and social history were all reviewed and documented in the EPIC chart.  Gynecologic History No LMP recorded. Patient is not currently having periods (Reason: Perimenopausal). Contraception: post menopausal status Last Pap: 2015. Results were: normal Last mammogram:2017ults were: normal  Obstetric History OB History  Gravida Para Term Preterm AB Living  0            SAB TAB Ectopic Multiple Live Births                    ROS: A ROS was performed and pertinent positives and negatives are included in the history.  GENERAL: No fevers or chills. HEENT: No change in vision, no earache, sore throat or sinus congestion. NECK: No pain or stiffness. CARDIOVASCULAR: No chest pain or pressure. No palpitations. PULMONARY: No shortness of breath, cough or wheeze. GASTROINTESTINAL: No abdominal pain, nausea, vomiting or diarrhea, melena or bright red blood per rectum. GENITOURINARY: No urinary frequency, urgency, hesitancy or dysuria. MUSCULOSKELETAL: No joint or muscle pain, no back pain, no recent trauma. DERMATOLOGIC: No rash, no itching, no lesions. ENDOCRINE: No polyuria, polydipsia, no heat or cold intolerance. No recent change in weight. HEMATOLOGICAL: No anemia or easy bruising or bleeding. NEUROLOGIC: No headache, seizures, numbness, tingling or weakness. PSYCHIATRIC: No depression, no loss of interest in normal activity or change in sleep pattern.      Exam: chaperone present  BP 132/80   Ht 5\' 3"  (1.6 m)   Wt 172 lb (78 kg)   BMI 30.47 kg/m   Body mass index is 30.47 kg/m.  General appearance : Well developed well nourished female. No acute distress HEENT: Eyes: no retinal hemorrhage or exudates,  Neck supple, trachea midline, no carotid bruits, no thyroidmegaly Lungs: Clear to auscultation, no rhonchi or wheezes, or rib retractions  Heart: Regular rate and rhythm, no murmurs or gallops Breast:Examined in sitting and supine position were symmetrical in appearance, no palpable masses or tenderness,  no skin retraction, no nipple inversion, no nipple discharge, no skin discoloration, no axillary or supraclavicular lymphadenopathy Abdomen: no palpable masses or tenderness, no rebound or guarding Extremities: no edema or skin discoloration or tenderness  Pelvic:  Bartholin, Urethra, Skene Glands: Within normal limits             Vagina: No gross lesions or discharge, mild atrophic changes  Cervix: No gross lesions or discharge  Uterus   antevertedmal size, shape and consistency, non-tender and mobile  Adnexa  Without masses or tenderness  Anus and perineum  normal   Rectovaginal  normal sphincter tone without palpated masses or tenderness             Hemoccult Cards provided   assessment/plan: 55 year old menopausal patient who is only complaint is vaginal dryness irritation and dyspareunia. Patient with no vasomotor symptoms. Patient on no hormone replacement therapy. FSH, firm last year in the menopausal range. We discussed different treatment options want  to include Osphena 60 mg daily its risks benefits and pros and cons were discussed as well as literature information was provided. The second option was Vagifem 10 g intravaginal tablet twice a week. I've given her prescription for both and depending on the insurance coverage she'll determine which when she will go and purchase. Her PCP is been doing her blood work. Patient will  not need a Pap smear until next year. She is scheduled for mammogram this month. We discussed importance of monthly breast exams. We discussed calcium vitamin D and weightbearing exercises for osteoporosis prevention.  10 additional minutes aside from her exam was spent discussing different treatment options for her vaginal dryness, atrophy, menopause, hormone replacement therapy.   Ok EdwardsFERNANDEZ,Hernan Turnage H MD, 4:54 PM 07/26/2016

## 2016-07-26 NOTE — Patient Instructions (Signed)
Bone Densitometry Introduction Bone densitometry is an imaging test that uses a special X-ray to measure the amount of calcium and other minerals in your bones (bone density). This test is also known as a bone mineral density test or dual-energy X-ray absorptiometry (DXA). The test can measure bone density at your hip and your spine. It is similar to having a regular X-ray. You may have this test to:  Diagnose a condition that causes weak or thin bones (osteoporosis).  Predict your risk of a broken bone (fracture).  Determine how well osteoporosis treatment is working. Tell a health care provider about:  Any allergies you have.  All medicines you are taking, including vitamins, herbs, eye drops, creams, and over-the-counter medicines.  Any problems you or family members have had with anesthetic medicines.  Any blood disorders you have.  Any surgeries you have had.  Any medical conditions you have.  Possibility of pregnancy.  Any other medical test you had within the previous 14 days that used contrast material. What are the risks? Generally, this is a safe procedure. However, problems can occur and may include the following:  This test exposes you to a very small amount of radiation.  The risks of radiation exposure may be greater to unborn children. What happens before the procedure?  Do not take any calcium supplements for 24 hours before having the test. You can otherwise eat and drink what you usually do.  Take off all metal jewelry, eyeglasses, dental appliances, and any other metal objects. What happens during the procedure?  You may lie on an exam table. There will be an X-ray generator below you and an imaging device above you.  Other devices, such as boxes or braces, may be used to position your body properly for the scan.  You will need to lie still while the machine slowly scans your body.  The images will show up on a computer monitor. What happens after the  procedure? You may need more testing at a later time. This information is not intended to replace advice given to you by your health care provider. Make sure you discuss any questions you have with your health care provider. Document Released: 08/22/2004 Document Revised: 01/06/2016 Document Reviewed: 01/08/2014  2017 Elsevier  

## 2016-08-08 ENCOUNTER — Ambulatory Visit
Admission: RE | Admit: 2016-08-08 | Discharge: 2016-08-08 | Disposition: A | Discharge status: Home / Self Care | Payer: 59 | Source: Ambulatory Visit | Attending: Gynecology | Admitting: Gynecology

## 2016-08-08 DIAGNOSIS — Z1231 Encounter for screening mammogram for malignant neoplasm of breast: Secondary | ICD-10-CM

## 2016-08-24 ENCOUNTER — Other Ambulatory Visit: Payer: Self-pay | Admitting: Gynecology

## 2016-08-24 ENCOUNTER — Ambulatory Visit (INDEPENDENT_AMBULATORY_CARE_PROVIDER_SITE_OTHER): Payer: Managed Care, Other (non HMO)

## 2016-08-24 DIAGNOSIS — Z13820 Encounter for screening for osteoporosis: Secondary | ICD-10-CM | POA: Diagnosis not present

## 2016-08-24 DIAGNOSIS — Z780 Asymptomatic menopausal state: Secondary | ICD-10-CM

## 2016-08-25 ENCOUNTER — Other Ambulatory Visit: Payer: Self-pay | Admitting: Anesthesiology

## 2016-08-25 DIAGNOSIS — Z1211 Encounter for screening for malignant neoplasm of colon: Secondary | ICD-10-CM

## 2016-12-27 ENCOUNTER — Encounter: Payer: Self-pay | Admitting: Gynecology

## 2017-06-14 ENCOUNTER — Other Ambulatory Visit: Payer: Self-pay | Admitting: Gynecology

## 2017-06-14 DIAGNOSIS — Z1231 Encounter for screening mammogram for malignant neoplasm of breast: Secondary | ICD-10-CM

## 2017-07-27 ENCOUNTER — Ambulatory Visit (INDEPENDENT_AMBULATORY_CARE_PROVIDER_SITE_OTHER): Payer: 59 | Admitting: Obstetrics & Gynecology

## 2017-07-27 ENCOUNTER — Encounter: Payer: Self-pay | Admitting: Obstetrics & Gynecology

## 2017-07-27 VITALS — BP 126/84 | Ht 63.0 in | Wt 155.0 lb

## 2017-07-27 DIAGNOSIS — Z780 Asymptomatic menopausal state: Secondary | ICD-10-CM

## 2017-07-27 DIAGNOSIS — N952 Postmenopausal atrophic vaginitis: Secondary | ICD-10-CM | POA: Diagnosis not present

## 2017-07-27 DIAGNOSIS — Z01411 Encounter for gynecological examination (general) (routine) with abnormal findings: Secondary | ICD-10-CM | POA: Diagnosis not present

## 2017-07-27 NOTE — Addendum Note (Signed)
Addended by: Berna SpareASTILLO, Anyiah Coverdale A on: 07/27/2017 04:35 PM   Modules accepted: Orders

## 2017-07-27 NOTE — Patient Instructions (Signed)
1. Encounter for gynecological examination with abnormal finding Gynecologic exam with mild vulvar and vaginal atrophy.  Pap reflex done.  Breast exam normal.  Screening mammogram scheduled.  Colonoscopy 2014.  Health labs with family physician.  2. Post-menopausal atrophic vaginitis Treatment with local estrogens discussed.  Patient prefers using Astroglide or coconut oil for intercourse at this time.  Will call back if desires local estrogen therapy.  Informed on Vagifem tablets, Estrace cream, Premarin cream and Estring.  3. Menopause present Well without hormone replacement therapy.  No postmenopausal bleeding.  Some postmenopausal atrophic vaginitis as discussed above.  Vitamin D supplements, calcium in nutrition and weightbearing physical activity recommended.  Bone density in 2018 was completely normal.  Candice Lee, it was a pleasure meeting you today!  I will inform you of your results as soon as available.  Atrophic Vaginitis Atrophic vaginitis is a condition in which the tissues that line the vagina become dry and thin. This condition is most common in women who have stopped having regular menstrual periods (menopause). This usually starts when a woman is 10445-55 years old. Estrogen helps to keep the vagina moist. It stimulates the vagina to produce a clear fluid that lubricates the vagina for sexual intercourse. This fluid also protects the vagina from infection. Lack of estrogen can cause the lining of the vagina to get thinner and dryer. The vagina may also shrink in size. It may become less elastic. Atrophic vaginitis tends to get worse over time as a woman's estrogen level drops. What are the causes? This condition is caused by the normal drop in estrogen that happens around the time of menopause. What increases the risk? Certain conditions or situations may lower a woman's estrogen level, which increases her risk of atrophic vaginitis. These include:  Taking medicine that blocks  estrogen.  Having ovaries removed surgically.  Being treated for cancer with X-ray treatment (radiation) or medicines (chemotherapy).  Exercising very hard and often.  Having an eating disorder (anorexia).  Giving birth or breastfeeding.  Being over the age of 55.  Smoking.  What are the signs or symptoms? Symptoms of this condition include:  Pain, soreness, or bleeding during sexual intercourse (dyspareunia).  Vaginal burning, irritation, or itching.  Pain or bleeding during a vaginal examination using a speculum (pelvic exam).  Loss of interest in sexual activity.  Having burning pain when passing urine.  Vaginal discharge that is brown or yellow.  In some cases, there are no symptoms. How is this diagnosed? This condition is diagnosed with a medical history and physical exam. This will include a pelvic exam that checks whether the inside of your vagina appears pale, thin, or dry. Rarely, you may also have other tests, including:  A urine test.  A test that checks the acid balance in your vaginal fluid (acid balance test).  How is this treated? Treatment for this condition may depend on the severity of your symptoms. Treatment may include:  Using an over-the-counter vaginal lubricant before you have sexual intercourse.  Using a long-acting vaginal moisturizer.  Using low-dose vaginal estrogen for moderate to severe symptoms that do not respond to other treatments. Options include creams, tablets, and inserts (vaginal rings). Before using vaginal estrogen, tell your health care provider if you have a history of: ? Breast cancer. ? Endometrial cancer. ? Blood clots.  Taking medicines. You may be able to take a daily pill for dyspareunia. Discuss all of the risks of this medicine with your health care provider. It is usually  not recommended for women who have a family history or personal history of breast cancer.  If your symptoms are very mild and you are not  sexually active, you may not need treatment. Follow these instructions at home:  Take medicines only as directed by your health care provider. Do not use herbal or alternative medicines unless your health care provider says that you can.  Use over-the-counter creams, lubricants, or moisturizers for dryness only as directed by your health care provider.  If your atrophic vaginitis is caused by menopause, discuss all of your menopausal symptoms and treatment options with your health care provider.  Do not douche.  Do not use products that can make your vagina dry. These include: ? Scented feminine sprays. ? Scented tampons. ? Scented soaps.  If it hurts to have sex, talk with your sexual partner. Contact a health care provider if:  Your discharge looks different than normal.  Your vagina has an unusual smell.  You have new symptoms.  Your symptoms do not improve with treatment.  Your symptoms get worse. This information is not intended to replace advice given to you by your health care provider. Make sure you discuss any questions you have with your health care provider. Document Released: 12/15/2014 Document Revised: 01/06/2016 Document Reviewed: 07/22/2014 Elsevier Interactive Patient Education  [employer]2018 [employer] Inc[employer]

## 2017-07-27 NOTE — Progress Notes (Signed)
Candice Lee 08-18-1960 [id number]   History:    56 y.o. G0 Married.  Adopted 2 girls, 55 yo [place] in [employer] and 55 yo [employer] in [place].  RP:  Established patient presenting for annual gyn exam   HPI: Menopause x 2 years, well on no hormone replacement therapy.  No postmenopausal bleeding.  No pelvic pain.  Dryness and discomfort with intercourse.  Breasts normal.  Urine and bowel movements normal.  Lost about 20 pounds since last year.  Body mass index 27.46 currently.  Joined the [employer] and exercises regularly.  Health labs with family physician.  Past medical history,surgical history, family history and social history were all reviewed and documented in the EPIC chart.  Gynecologic History No LMP recorded. Patient is not currently having periods (Reason: Perimenopausal). Contraception: post menopausal status Last Pap: 07/2014. Results were: negative/HPV HR neg Last mammogram: 07/2016. Results were: normal.  Scheduled for 12/26th. Bone Density normal 2018 [place] 2014  Obstetric History OB History  Gravida Para Term Preterm AB Living  0            SAB TAB Ectopic Multiple Live Births                    ROS: A ROS was performed and pertinent positives and negatives are included in the history.  GENERAL: No fevers or chills. HEENT: No change in vision, no earache, sore throat or sinus congestion. NECK: No pain or stiffness. CARDIOVASCULAR: No chest pain or pressure. No palpitations. PULMONARY: No shortness of breath, cough or wheeze. GASTROINTESTINAL: No abdominal pain, nausea, vomiting or diarrhea, melena or bright red blood per rectum. GENITOURINARY: No urinary frequency, urgency, hesitancy or dysuria. MUSCULOSKELETAL: No joint or muscle pain, no back pain, no recent trauma. DERMATOLOGIC: No rash, no itching, no lesions. ENDOCRINE: No polyuria, polydipsia, no heat or cold intolerance. No recent change in weight. HEMATOLOGICAL: No anemia or easy bruising or  bleeding. NEUROLOGIC: No headache, seizures, numbness, tingling or weakness. PSYCHIATRIC: No depression, no loss of interest in normal activity or change in sleep pattern.     Exam:   BP 126/84   Ht 5\' 3"  (1.6 m)   Wt 155 lb (70.3 kg)   BMI 27.46 kg/m   Body mass index is 27.46 kg/m.  General appearance : Well developed well nourished female. No acute distress HEENT: Eyes: no retinal hemorrhage or exudates,  Neck supple, trachea midline, no carotid bruits, no thyroidmegaly Lungs: Clear to auscultation, no rhonchi or wheezes, or rib retractions  Heart: Regular rate and rhythm, no murmurs or gallops Breast:Examined in sitting and supine position were symmetrical in appearance, no palpable masses or tenderness,  no skin retraction, no nipple inversion, no nipple discharge, no skin discoloration, no axillary or supraclavicular lymphadenopathy Abdomen: no palpable masses or tenderness, no rebound or guarding Extremities: no edema or skin discoloration or tenderness  Pelvic: Vulva normal, with mild menopausal atrophy  Bartholin, Urethra, Skene Glands: Within normal limits             Vagina: No gross lesions or discharge  Cervix: No gross lesions or discharge.  Pap reflex done.  Uterus  AV, normal size, shape and consistency, non-tender and mobile  Adnexa  Without masses or tenderness  Anus and perineum  normal    Assessment/Plan:  56 y.o. female for annual exam   1. Encounter for gynecological examination with abnormal finding Gynecologic exam with mild vulvar and vaginal atrophy.  Pap reflex done.  Breast exam normal.  Screening mammogram scheduled.  Colonoscopy 2014.  Health labs with family physician.  2. Post-menopausal atrophic vaginitis Treatment with local estrogens discussed.  Patient prefers using Astroglide or coconut oil for intercourse at this time.  Will call back if desires local estrogen therapy.  Informed on Vagifem tablets, Estrace cream, Premarin cream and  Estring.  3. Menopause present Well without hormone replacement therapy.  No postmenopausal bleeding.  Some postmenopausal atrophic vaginitis as discussed above.  Vitamin D supplements, calcium in nutrition and weightbearing physical activity recommended.  Bone density in 2018 was completely normal.  Candice DelMarie-Lyne Alfons Sulkowski MD, 3:14 PM 07/27/2017

## 2017-07-31 LAB — PAP IG W/ RFLX HPV ASCU

## 2017-08-09 ENCOUNTER — Ambulatory Visit
Admission: RE | Admit: 2017-08-09 | Discharge: 2017-08-09 | Disposition: A | Discharge status: Home / Self Care | Payer: 59 | Source: Ambulatory Visit | Attending: Gynecology | Admitting: Gynecology

## 2017-08-09 DIAGNOSIS — Z1231 Encounter for screening mammogram for malignant neoplasm of breast: Secondary | ICD-10-CM

## 2018-07-29 ENCOUNTER — Encounter: Payer: Self-pay | Admitting: Obstetrics & Gynecology

## 2018-07-29 ENCOUNTER — Ambulatory Visit (INDEPENDENT_AMBULATORY_CARE_PROVIDER_SITE_OTHER): Payer: 59 | Admitting: Obstetrics & Gynecology

## 2018-07-29 VITALS — BP 120/78 | Ht 63.0 in | Wt 153.0 lb

## 2018-07-29 DIAGNOSIS — E663 Overweight: Secondary | ICD-10-CM | POA: Diagnosis not present

## 2018-07-29 DIAGNOSIS — Z01419 Encounter for gynecological examination (general) (routine) without abnormal findings: Secondary | ICD-10-CM | POA: Diagnosis not present

## 2018-07-29 DIAGNOSIS — Z780 Asymptomatic menopausal state: Secondary | ICD-10-CM

## 2018-07-29 NOTE — Patient Instructions (Signed)
1. Well female exam with routine gynecological exam Normal gynecologic exam.  Pap test negative in October 2018.  No indication to repeat this year.  Breast exam normal.  Screening mammogram in December 2018 was negative.  Screening mammogram scheduled in January 2020.  Health labs with family physician.  Colonoscopy in 2019.  2. Post-menopausal Well on no hormone replacement therapy.  No postmenopausal bleeding.  Bone density was in January 2018.  Will repeat at 5 years.  Recommend vitamin D supplements, calcium intake of 1.5 g/day and regular weightbearing physical activity.  3. Overweight (BMI 25.0-29.9) Patient is physically active, recommend continuing with aerobic physical activities 5 times a week and weightlifting every 2 days.  Lower calorie/carb diet such as [employer] diet.  Other orders - Multiple Vitamin (MULTIVITAMIN WITH MINERALS) TABS tablet; Take 1 tablet by mouth daily.  Britta MccreedyBarbara, it was a pleasure seeing you today!

## 2018-07-29 NOTE — Progress Notes (Signed)
Denetra Formoso 1960/08/31 [id number]   History:    57 y.o. G0 Married.  2 adopted daughtes.  Sophomore in college, senior in [employer]  RP:  Established patient presenting for annual gyn exam   HPI: Menopause, well on no hormone replacement therapy.  No postmenopausal bleeding.  No pelvic pain.  No pain with intercourse.  Urine and bowel movements normal.  Breasts normal.  Body mass index 27.10.  Exercising regularly.  Health labs with family physician.  Colonoscopy 2019.  Bone density normal in 2018.  Past medical history,surgical history, family history and social history were all reviewed and documented in the EPIC chart.  Gynecologic History No LMP recorded. (Menstrual status: Perimenopausal). Contraception: post menopausal status Last Pap: 07/2017. Results were: Negative Last mammogram: 07/2017. Results were: Negative.  Scheduled 08/2018. Bone Density: 08/2016 Normal, will repeat at 5 years Colonoscopy: 2019  Obstetric History OB History  Gravida Para Term Preterm AB Living  0            SAB TAB Ectopic Multiple Live Births                ROS: A ROS was performed and pertinent positives and negatives are included in the history.  GENERAL: No fevers or chills. HEENT: No change in vision, no earache, sore throat or sinus congestion. NECK: No pain or stiffness. CARDIOVASCULAR: No chest pain or pressure. No palpitations. PULMONARY: No shortness of breath, cough or wheeze. GASTROINTESTINAL: No abdominal pain, nausea, vomiting or diarrhea, melena or bright red blood per rectum. GENITOURINARY: No urinary frequency, urgency, hesitancy or dysuria. MUSCULOSKELETAL: No joint or muscle pain, no back pain, no recent trauma. DERMATOLOGIC: No rash, no itching, no lesions. ENDOCRINE: No polyuria, polydipsia, no heat or cold intolerance. No recent change in weight. HEMATOLOGICAL: No anemia or easy bruising or bleeding. NEUROLOGIC: No headache, seizures, numbness, tingling or weakness. PSYCHIATRIC: No  depression, no loss of interest in normal activity or change in sleep pattern.     Exam:   BP 120/78   Ht 5\' 3"  (1.6 m)   Wt 153 lb (69.4 kg)   BMI 27.10 kg/m   Body mass index is 27.1 kg/m.  General appearance : Well developed well nourished female. No acute distress HEENT: Eyes: no retinal hemorrhage or exudates,  Neck supple, trachea midline, no carotid bruits, no thyroidmegaly Lungs: Clear to auscultation, no rhonchi or wheezes, or rib retractions  Heart: Regular rate and rhythm, no murmurs or gallops Breast:Examined in sitting and supine position were symmetrical in appearance, no palpable masses or tenderness,  no skin retraction, no nipple inversion, no nipple discharge, no skin discoloration, no axillary or supraclavicular lymphadenopathy Abdomen: no palpable masses or tenderness, no rebound or guarding Extremities: no edema or skin discoloration or tenderness  Pelvic: Vulva: Normal             Vagina: No gross lesions or discharge  Cervix: No gross lesions or discharge  Uterus  AV, normal size, shape and consistency, non-tender and mobile  Adnexa  Without masses or tenderness  Anus: Normal   Assessment/Plan:  57 y.o. female for annual exam   1. Well female exam with routine gynecological exam Normal gynecologic exam.  Pap test negative in October 2018.  No indication to repeat this year.  Breast exam normal.  Screening mammogram in December 2018 was negative.  Screening mammogram scheduled in January 2020.  Health labs with family physician.  Colonoscopy in 2019.  2. Post-menopausal Well on no hormone replacement therapy.  No postmenopausal bleeding.  Bone density was in January 2018.  Will repeat at 5 years.  Recommend vitamin D supplements, calcium intake of 1.5 g/day and regular weightbearing physical activity.  3. Overweight (BMI 25.0-29.9) Patient is physically active, recommend continuing with aerobic physical activities 5 times a week and weightlifting every 2  days.  Lower calorie/carb diet such as [employer] diet.  Other orders - Multiple Vitamin (MULTIVITAMIN WITH MINERALS) TABS tablet; Take 1 tablet by mouth daily.  Genia DelMarie-Lyne Kimi Kroft MD, 3:28 PM 07/29/2018

## 2018-08-23 ENCOUNTER — Other Ambulatory Visit: Payer: Self-pay | Admitting: Obstetrics & Gynecology

## 2018-08-23 DIAGNOSIS — Z1231 Encounter for screening mammogram for malignant neoplasm of breast: Secondary | ICD-10-CM

## 2018-09-24 ENCOUNTER — Ambulatory Visit
Admission: RE | Admit: 2018-09-24 | Discharge: 2018-09-24 | Disposition: A | Discharge status: Home / Self Care | Payer: 59 | Source: Ambulatory Visit | Attending: Obstetrics & Gynecology | Admitting: Obstetrics & Gynecology

## 2018-09-24 DIAGNOSIS — Z1231 Encounter for screening mammogram for malignant neoplasm of breast: Secondary | ICD-10-CM

## 2018-09-24 IMAGING — MG DIGITAL SCREENING BILATERAL MAMMOGRAM WITH TOMO AND CAD
8 series · 9 of 24 positions shown · non-contrast
Comparison: Previous exam(s).

CLINICAL DATA: Screening.

EXAM:
DIGITAL SCREENING BILATERAL MAMMOGRAM WITH TOMO AND CAD

[R CC synth-2D]
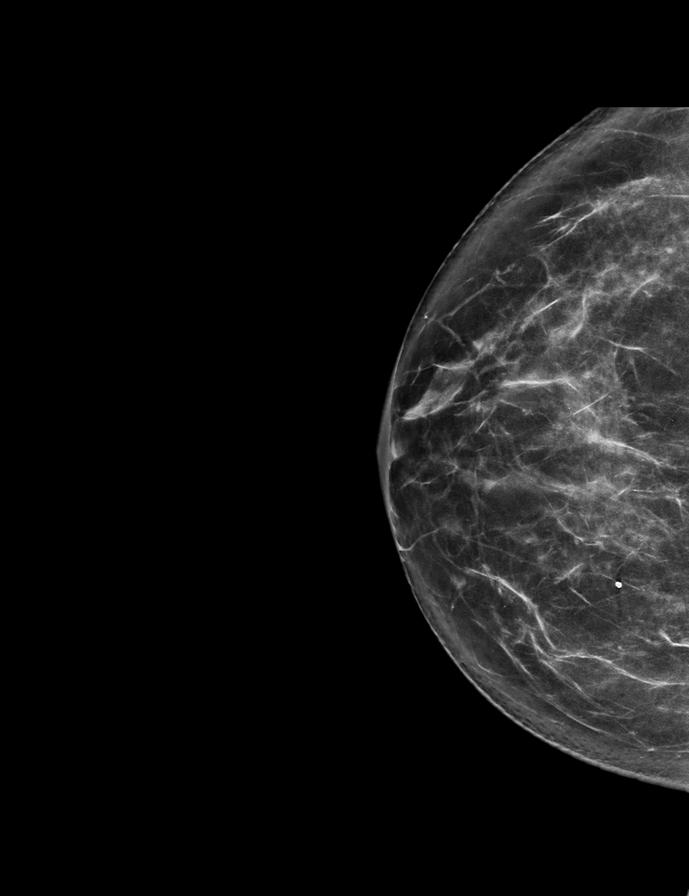

[L MLO synth-2D]
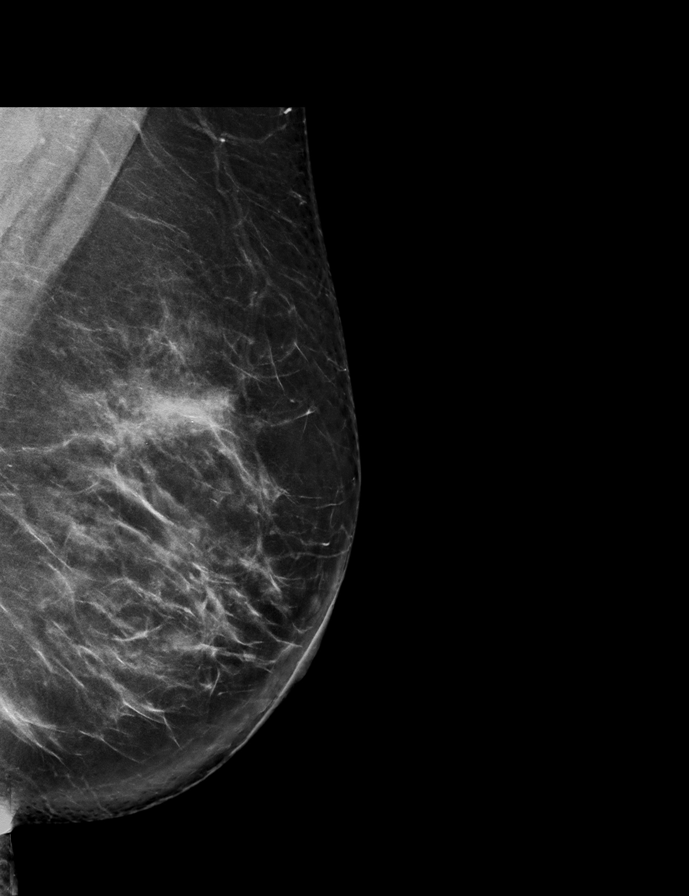

[R MLO synth-2D]
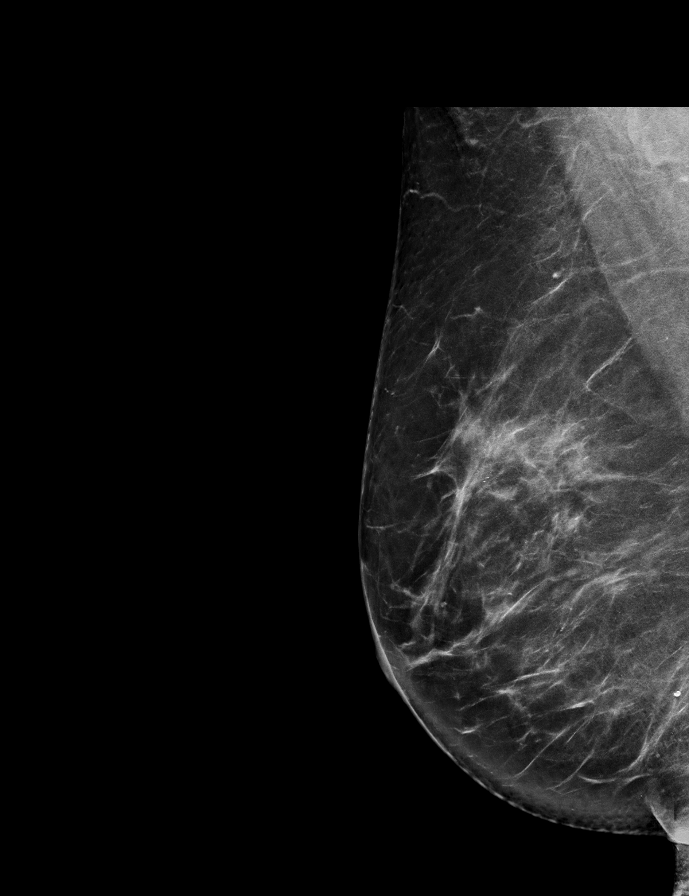

[L CC synth-2D]
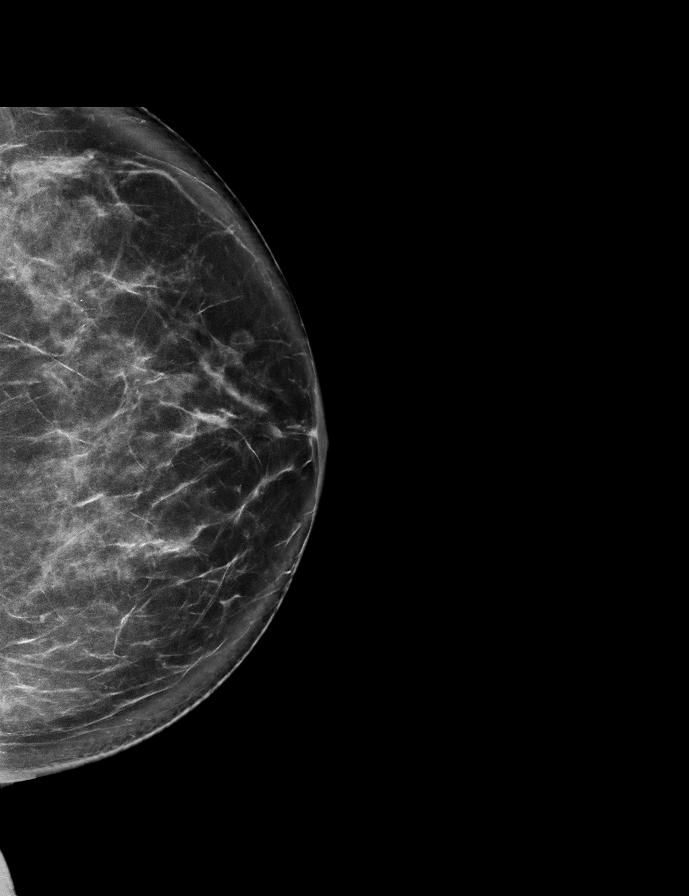

[L MLO tomo · 2 of 87 frames shown]
[frame 29/87]
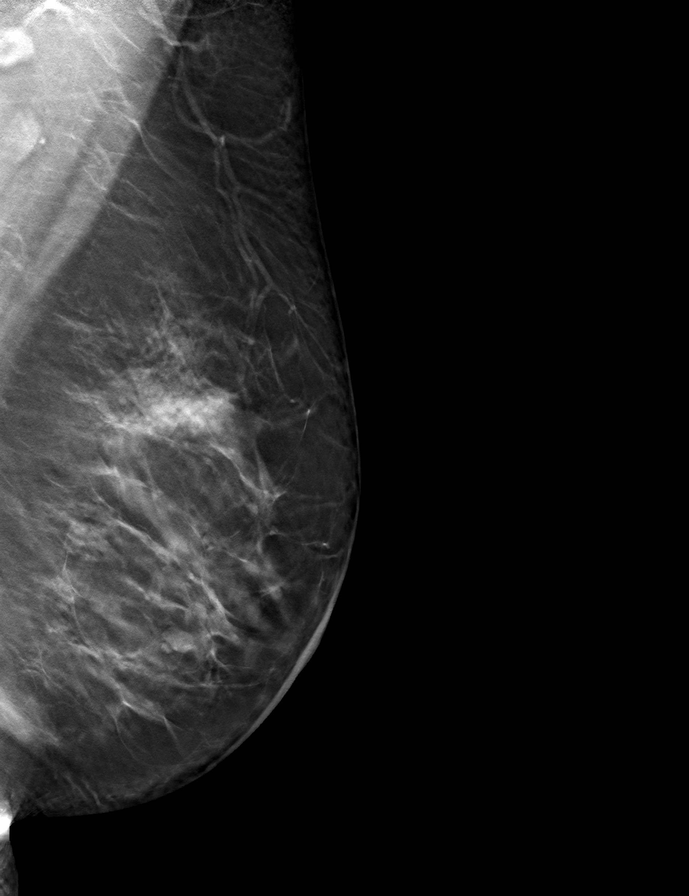
[frame 44/87]
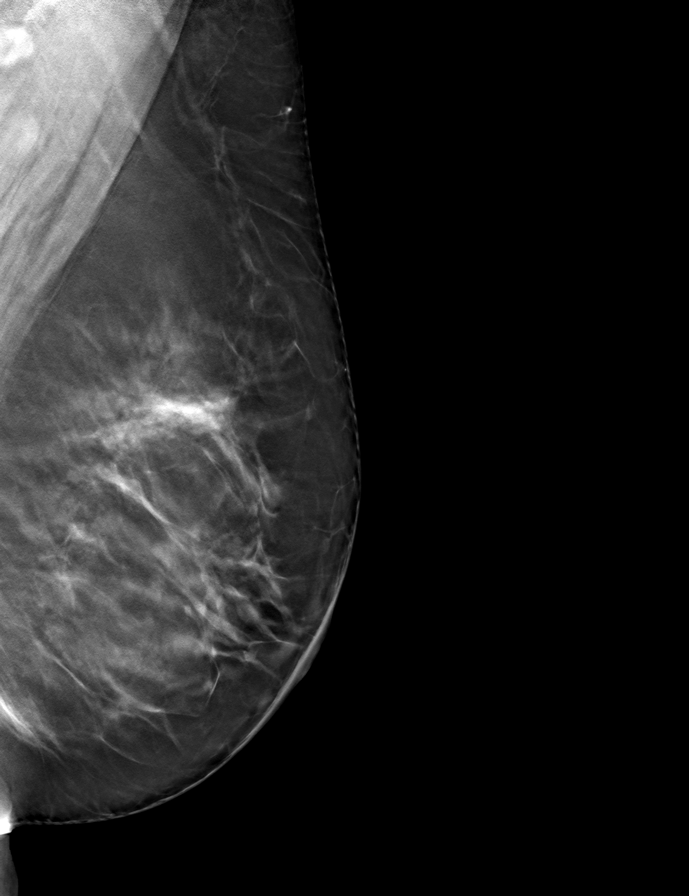

[R MLO tomo · tomo slice 43/85.0]
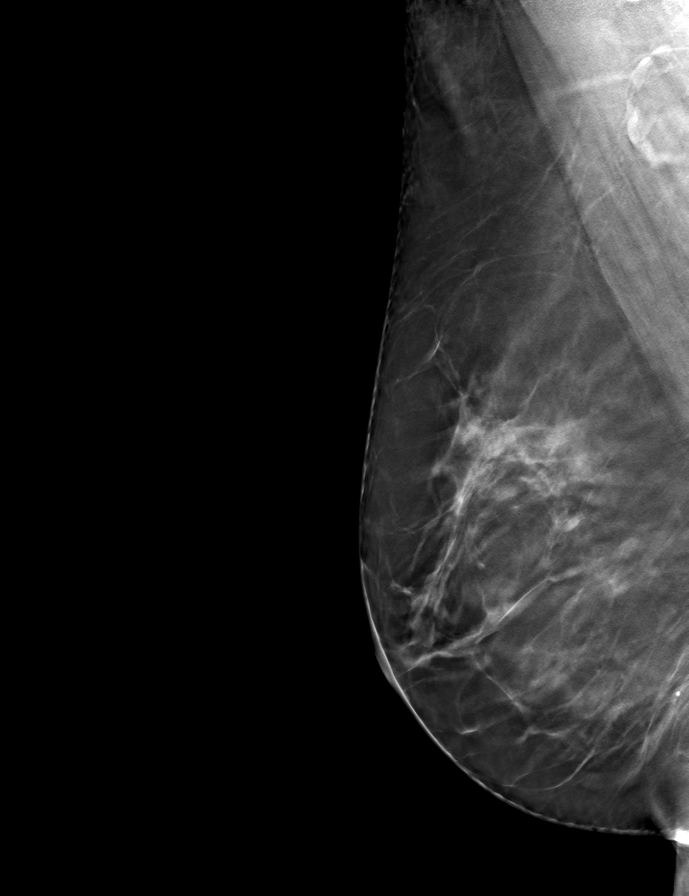

[R CC tomo · tomo slice 37/72.0]
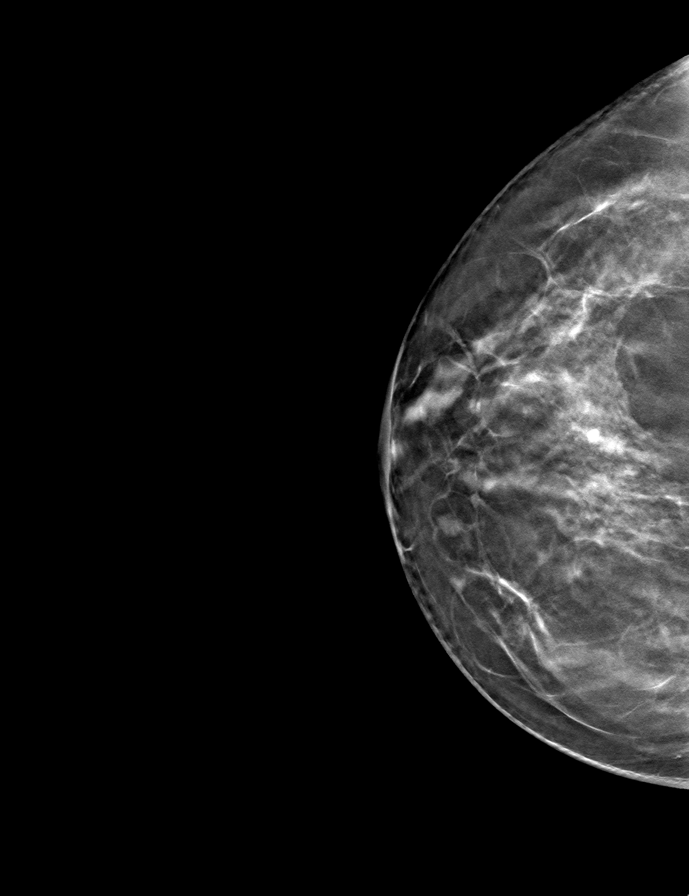

[L CC tomo · tomo slice 42/83.0]
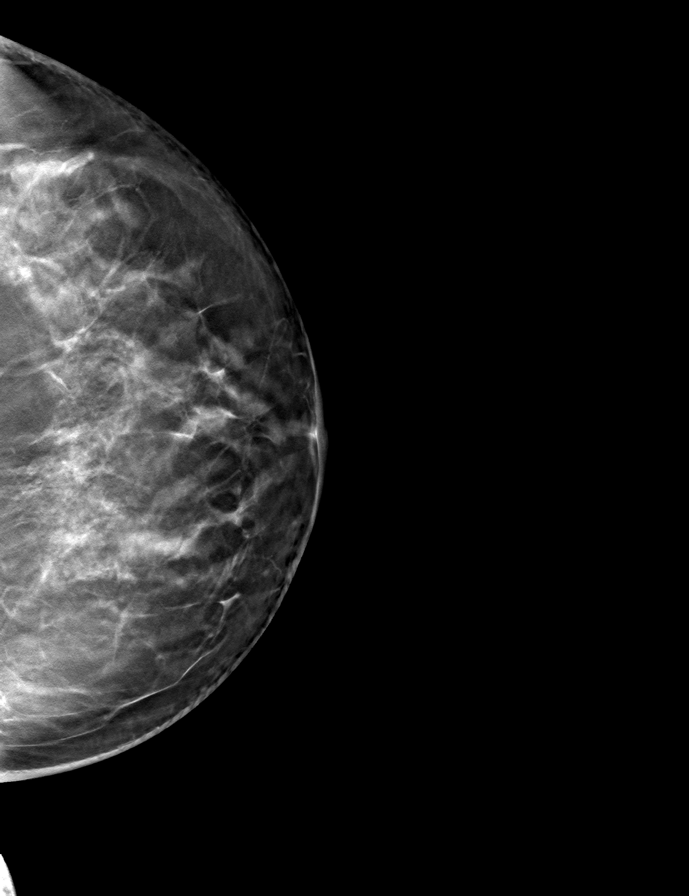

[9 of 24 positions shown; findings below may reference images not displayed]

ACR Breast Density Category c: The breast tissue is heterogeneously
dense, which may obscure small masses.
FINDINGS: There are no findings suspicious for malignancy. Images were
processed with CAD.
IMPRESSION: No mammographic evidence of malignancy. A result letter of this
screening mammogram will be mailed directly to the patient.

RECOMMENDATION:
Screening mammogram in one year. (Code:[5V])

BI-RADS CATEGORY  1: Negative.

## 2019-07-30 ENCOUNTER — Other Ambulatory Visit: Payer: Self-pay

## 2019-07-31 ENCOUNTER — Encounter: Payer: Self-pay | Admitting: Obstetrics & Gynecology

## 2019-07-31 ENCOUNTER — Ambulatory Visit (INDEPENDENT_AMBULATORY_CARE_PROVIDER_SITE_OTHER): Payer: 59 | Admitting: Obstetrics & Gynecology

## 2019-07-31 ENCOUNTER — Other Ambulatory Visit: Payer: Self-pay

## 2019-07-31 VITALS — BP 110/80 | Ht 63.0 in | Wt 162.0 lb

## 2019-07-31 DIAGNOSIS — Z780 Asymptomatic menopausal state: Secondary | ICD-10-CM

## 2019-07-31 DIAGNOSIS — Z01419 Encounter for gynecological examination (general) (routine) without abnormal findings: Secondary | ICD-10-CM | POA: Diagnosis not present

## 2019-07-31 DIAGNOSIS — E663 Overweight: Secondary | ICD-10-CM

## 2019-07-31 NOTE — Patient Instructions (Signed)
1. Well female exam with routine gynecological exam Normal gynecologic exam in menopause.  Pap test December 2018 was negative, will repeat at 3 years.  Breast exam normal.  Screening mammogram February 2020 was negative.  Colonoscopy in 2019.  Health labs with family physician.  2. Post-menopausal Post menopause, well on no hormone replacement therapy.  No postmenopausal bleeding.  Recommend vitamin D supplements, calcium intake of 1200 mg daily and regular weightbearing physical activities.  3. Overweight (BMI 25.0-29.9) Stable weight since last year.  Recommend to increase aerobic activities to 5 times a week and light weightlifting every 2 days.  Continue on a healthy nutrition  Candice Lee, it was a pleasure seeing you today!

## 2019-07-31 NOTE — Progress Notes (Signed)
Candice Lee 1961-02-23 [id number]   History:    58 y.o. G0 Married.  2 adopted daughters, 61 and 66 yo.  RP:  Established patient presenting for annual gyn exam   HPI: Postmenopause, well on no hormone replacement therapy.  No postmenopausal bleeding.  No pelvic pain.  No pain with intercourse.  Urine and bowel movements normal.  Breasts normal.  Body mass index 28.70.  Exercising regularly.  Health labs with family physician.  Colonoscopy 2019.  Bone density normal in 2018.   Past medical history,surgical history, family history and social history were all reviewed and documented in the EPIC chart.  Gynecologic History No LMP recorded. (Menstrual status: Perimenopausal).  Obstetric History OB History  Gravida Para Term Preterm AB Living  0 0 0 0 0 0  SAB TAB Ectopic Multiple Live Births  0 0 0 0 0     ROS: A ROS was performed and pertinent positives and negatives are included in the history.  GENERAL: No fevers or chills. HEENT: No change in vision, no earache, sore throat or sinus congestion. NECK: No pain or stiffness. CARDIOVASCULAR: No chest pain or pressure. No palpitations. PULMONARY: No shortness of breath, cough or wheeze. GASTROINTESTINAL: No abdominal pain, nausea, vomiting or diarrhea, melena or bright red blood per rectum. GENITOURINARY: No urinary frequency, urgency, hesitancy or dysuria. MUSCULOSKELETAL: No joint or muscle pain, no back pain, no recent trauma. DERMATOLOGIC: No rash, no itching, no lesions. ENDOCRINE: No polyuria, polydipsia, no heat or cold intolerance. No recent change in weight. HEMATOLOGICAL: No anemia or easy bruising or bleeding. NEUROLOGIC: No headache, seizures, numbness, tingling or weakness. PSYCHIATRIC: No depression, no loss of interest in normal activity or change in sleep pattern.     Exam:   BP 110/80 (BP Location: Right Arm, Patient Position: Sitting, Cuff Size: Normal)   Ht 5\' 3"  (1.6 m)   Wt 162 lb (73.5 kg)   BMI 28.70  kg/m   Body mass index is 28.7 kg/m.  General appearance : Well developed well nourished female. No acute distress HEENT: Eyes: no retinal hemorrhage or exudates,  Neck supple, trachea midline, no carotid bruits, no thyroidmegaly Lungs: Clear to auscultation, no rhonchi or wheezes, or rib retractions  Heart: Regular rate and rhythm, no murmurs or gallops Breast:Examined in sitting and supine position were symmetrical in appearance, no palpable masses or tenderness,  no skin retraction, no nipple inversion, no nipple discharge, no skin discoloration, no axillary or supraclavicular lymphadenopathy Abdomen: no palpable masses or tenderness, no rebound or guarding Extremities: no edema or skin discoloration or tenderness  Pelvic: Vulva: Normal             Vagina: No gross lesions or discharge  Cervix: No gross lesions or discharge  Uterus  AV, normal size, shape and consistency, non-tender and mobile  Adnexa  Without masses or tenderness  Anus: Normal   Assessment/Plan:  58 y.o. female for annual exam   1. Well female exam with routine gynecological exam Normal gynecologic exam in menopause.  Pap test December 2018 was negative, will repeat at 3 years.  Breast exam normal.  Screening mammogram February 2020 was negative.  Colonoscopy in 2019.  Health labs with family physician.  2. Post-menopausal Post menopause, well on no hormone replacement therapy.  No postmenopausal bleeding.  Recommend vitamin D supplements, calcium intake of 1200 mg daily and regular weightbearing physical activities.  3. Overweight (BMI 25.0-29.9) Stable weight since last year.  Recommend to increase aerobic activities to  5 times a week and light weightlifting every 2 days.  Continue on a healthy nutrition  Princess Bruins MD, 3:10 PM 07/31/2019

## 2019-08-22 ENCOUNTER — Other Ambulatory Visit: Payer: Self-pay | Admitting: Obstetrics & Gynecology

## 2019-08-22 DIAGNOSIS — Z1231 Encounter for screening mammogram for malignant neoplasm of breast: Secondary | ICD-10-CM

## 2019-09-29 ENCOUNTER — Other Ambulatory Visit: Payer: Self-pay

## 2019-09-29 ENCOUNTER — Ambulatory Visit
Admission: RE | Admit: 2019-09-29 | Discharge: 2019-09-29 | Disposition: A | Discharge status: Home / Self Care | Payer: PRIVATE HEALTH INSURANCE | Source: Ambulatory Visit | Attending: Obstetrics & Gynecology | Admitting: Obstetrics & Gynecology

## 2019-09-29 DIAGNOSIS — Z1231 Encounter for screening mammogram for malignant neoplasm of breast: Secondary | ICD-10-CM

## 2019-09-29 IMAGING — MG DIGITAL SCREENING BILAT W/ TOMO W/ CAD
8 series · 8 of 24 positions shown · non-contrast
Comparison: Previous exam(s).

CLINICAL DATA: Screening.

EXAM:
DIGITAL SCREENING BILATERAL MAMMOGRAM WITH TOMO AND CAD

[L MLO synth-2D]
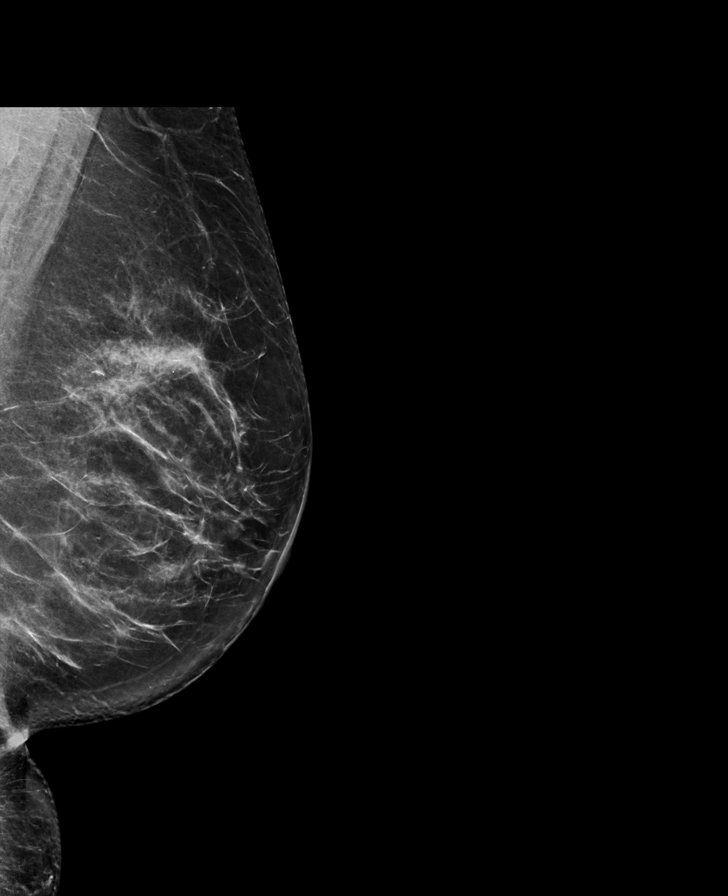

[R MLO synth-2D]
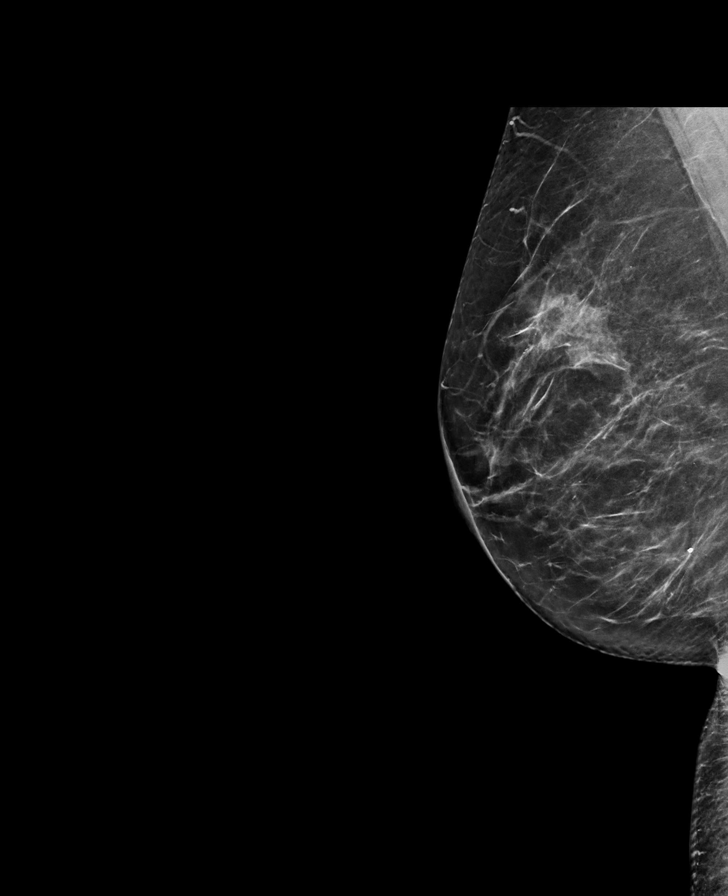

[R CC synth-2D]
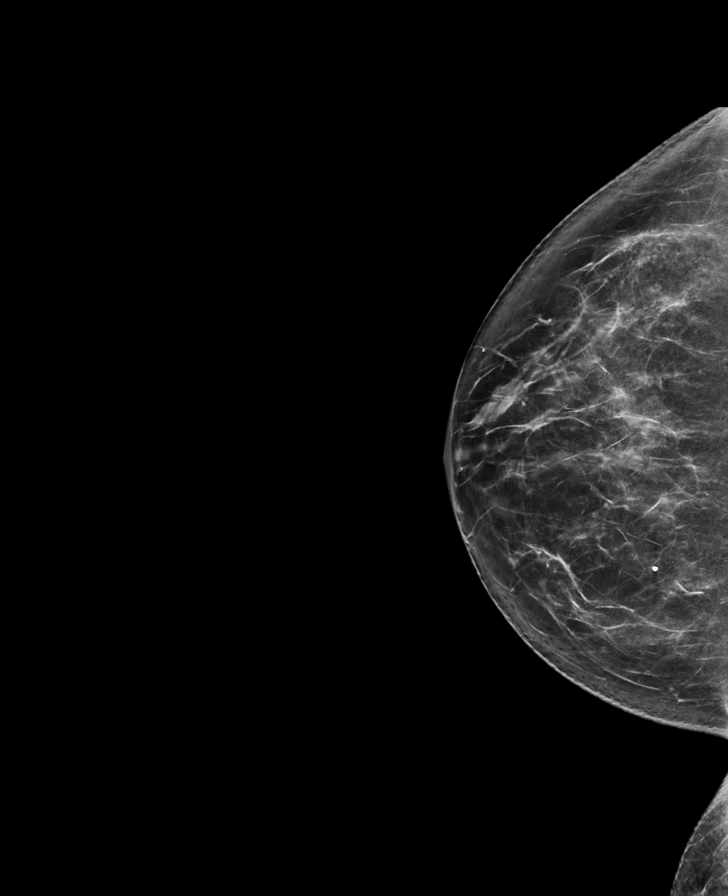

[L CC synth-2D]
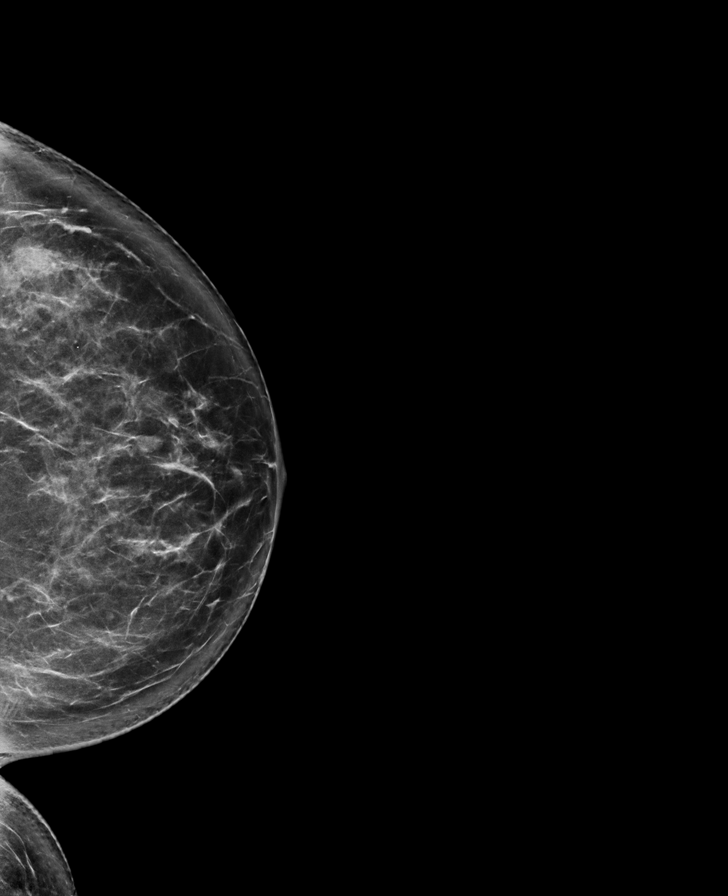

[L MLO tomo · tomo slice 41/82.0]
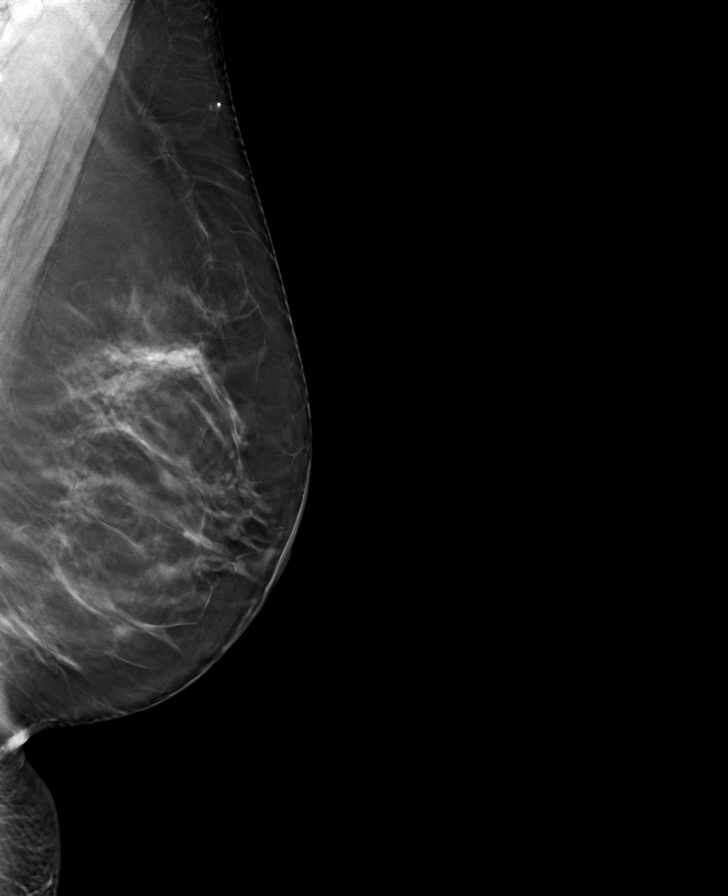

[L CC tomo · tomo slice 42/83.0]
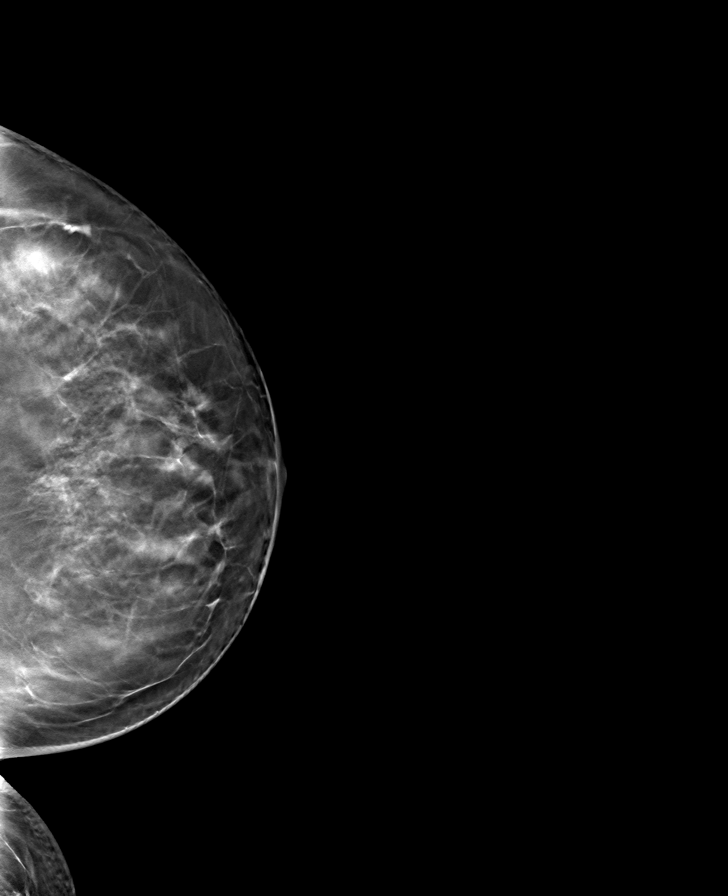

[R MLO tomo · tomo slice 37/73.0]
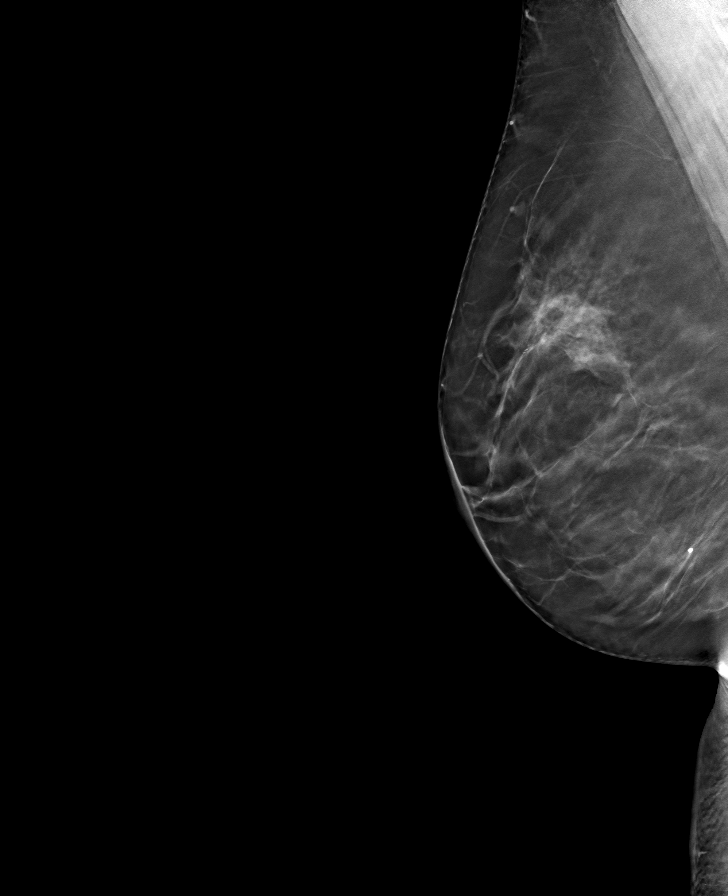

[R CC tomo · tomo slice 37/73.0]
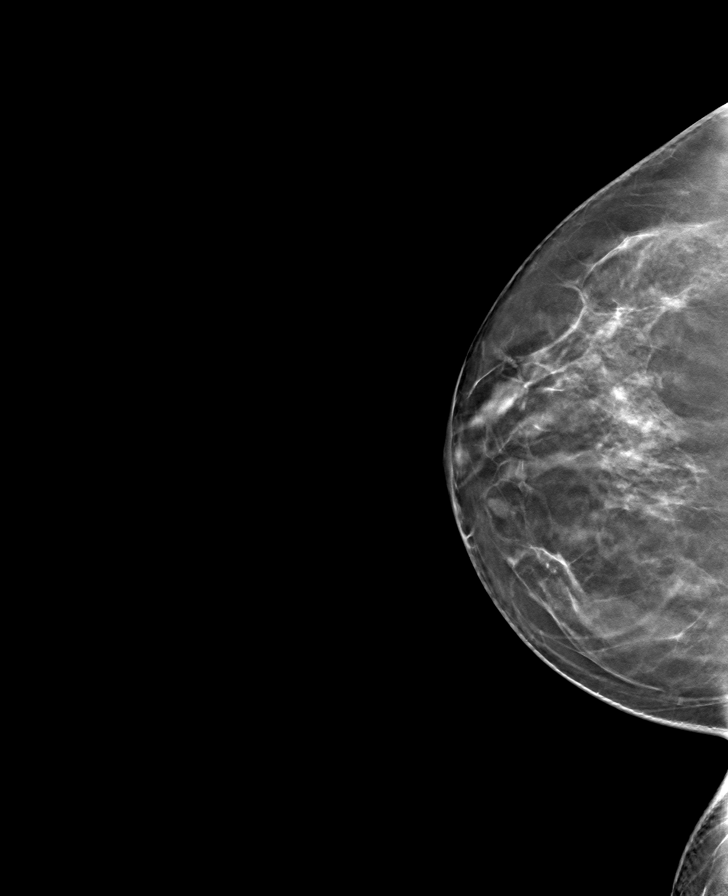

[8 of 24 positions shown; findings below may reference images not displayed]

ACR Breast Density Category c: The breast tissue is heterogeneously
dense, which may obscure small masses.
FINDINGS: In the left breast, a possible mass warrants further evaluation. In
the right breast, no findings suspicious for malignancy.

Images were processed with CAD.
IMPRESSION: Further evaluation is suggested for possible mass in the left
breast.

RECOMMENDATION:
Diagnostic mammogram and possibly ultrasound of the left breast.
(Code:[FW])

The patient will be contacted regarding the findings, and additional
imaging will be scheduled.

BI-RADS CATEGORY  0: Incomplete. Need additional imaging evaluation
and/or prior mammograms for comparison.

## 2019-10-01 ENCOUNTER — Other Ambulatory Visit: Payer: Self-pay | Admitting: Obstetrics & Gynecology

## 2019-10-01 DIAGNOSIS — R928 Other abnormal and inconclusive findings on diagnostic imaging of breast: Secondary | ICD-10-CM

## 2019-10-15 ENCOUNTER — Ambulatory Visit
Admission: RE | Admit: 2019-10-15 | Discharge: 2019-10-15 | Disposition: A | Discharge status: Home / Self Care | Payer: PRIVATE HEALTH INSURANCE | Source: Ambulatory Visit | Attending: Obstetrics & Gynecology | Admitting: Obstetrics & Gynecology

## 2019-10-15 ENCOUNTER — Other Ambulatory Visit: Payer: Self-pay

## 2019-10-15 DIAGNOSIS — R928 Other abnormal and inconclusive findings on diagnostic imaging of breast: Secondary | ICD-10-CM

## 2019-10-15 IMAGING — US US BREAST*L* LIMITED INC AXILLA
1 series · 7 of 7 positions shown · non-contrast
Comparison: Previous exam(s).

CLINICAL DATA: Recall from screening mammography with
tomosynthesis, possible mass involving the OUTER LEFT breast at
POSTERIOR depth.

EXAM:
DIGITAL DIAGNOSTIC LEFT MAMMOGRAM WITH TOMO
ULTRASOUND LEFT BREAST

[Series 1: us breast*left* limited inc axilla · 0.07mm/px · 7 of 7 slices shown]
[im 1/7]
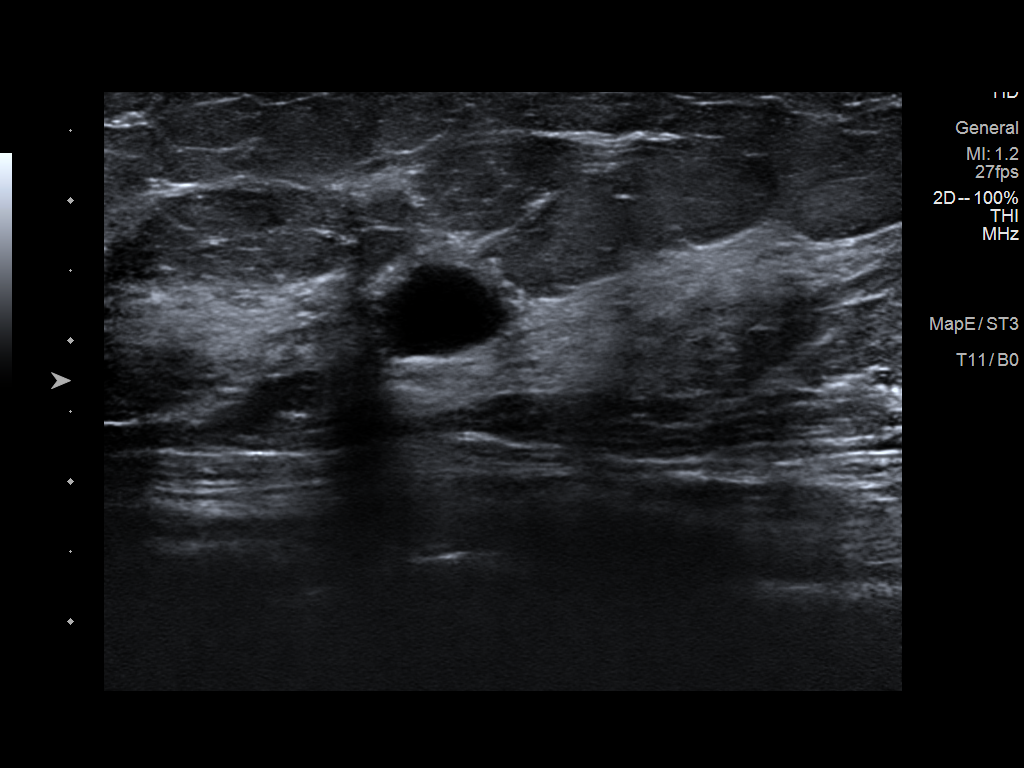
[im 2/7]
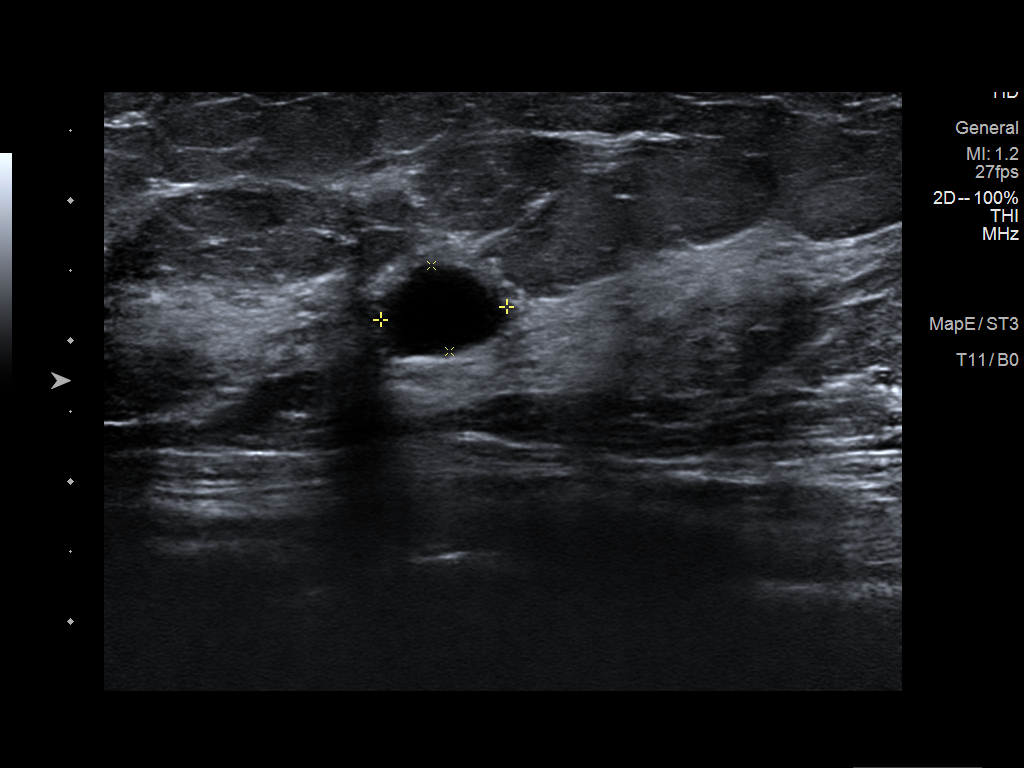
[im 3/7]
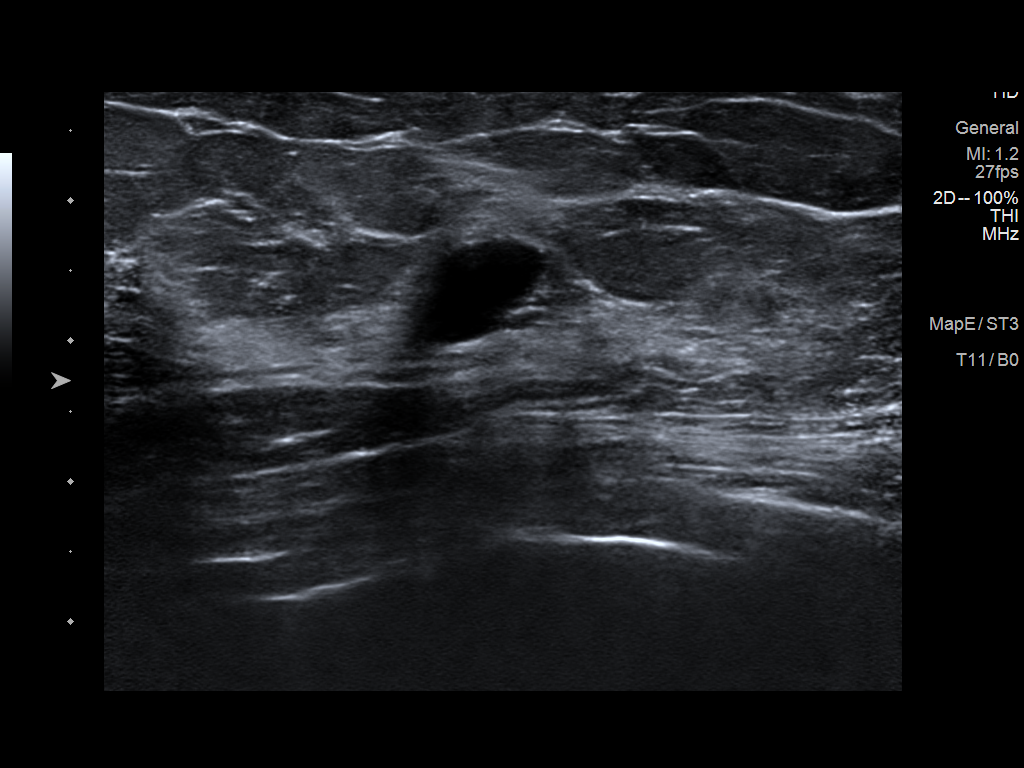
[im 4/7]
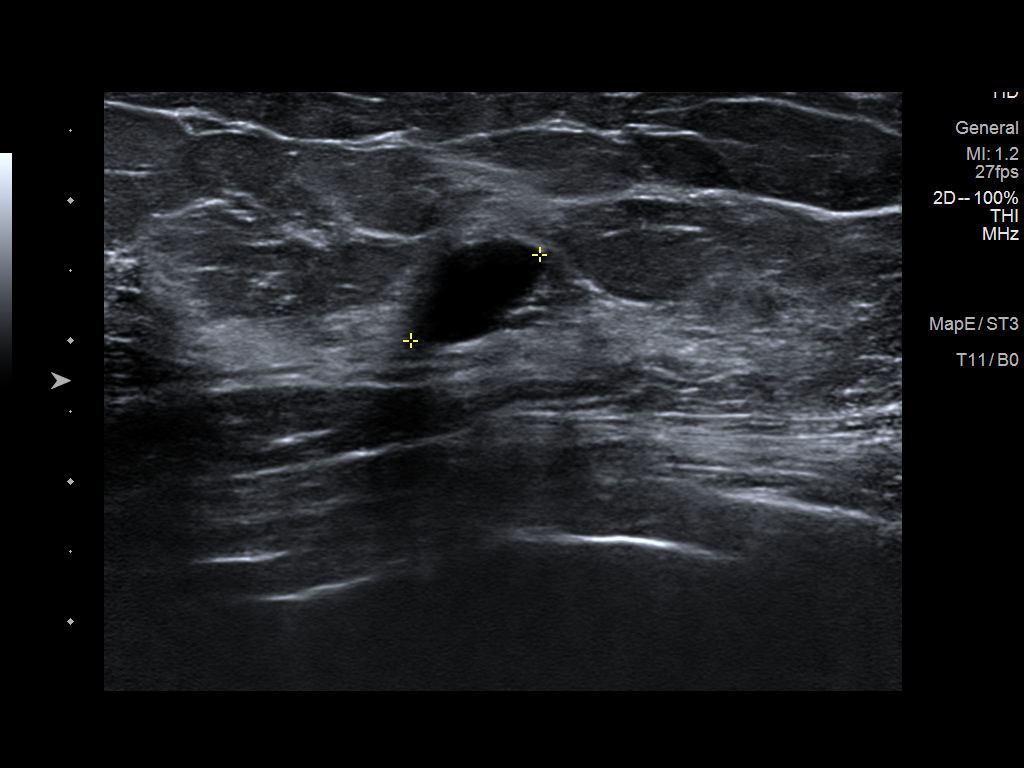
[im 5/7]
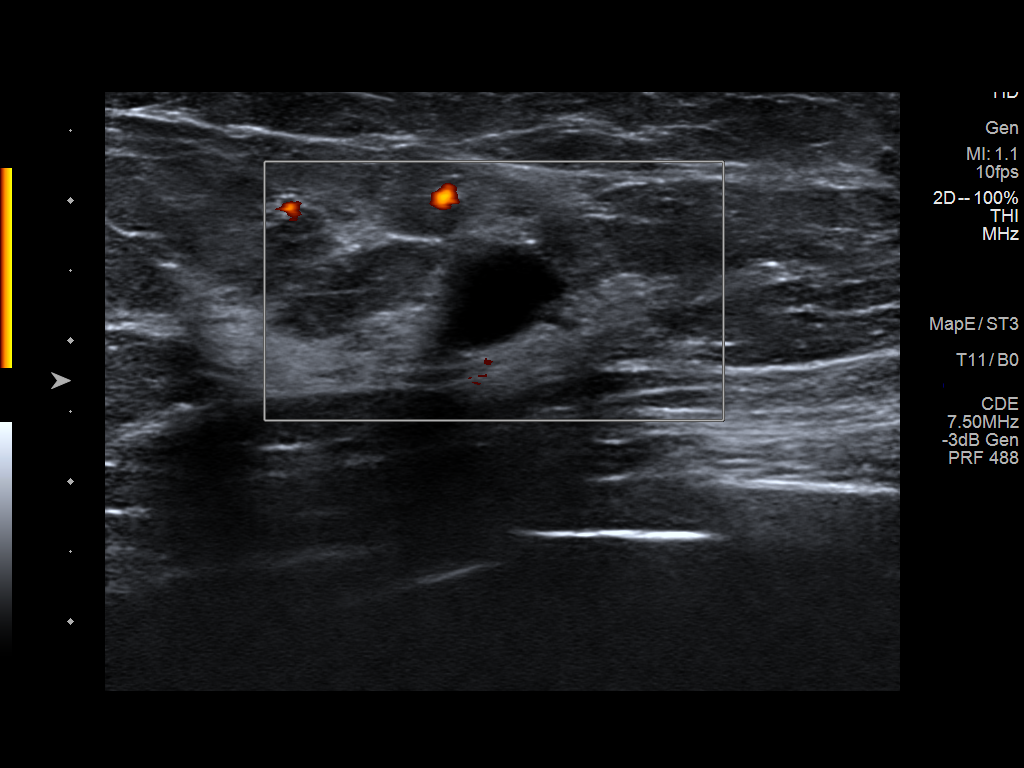
[im 6/7]
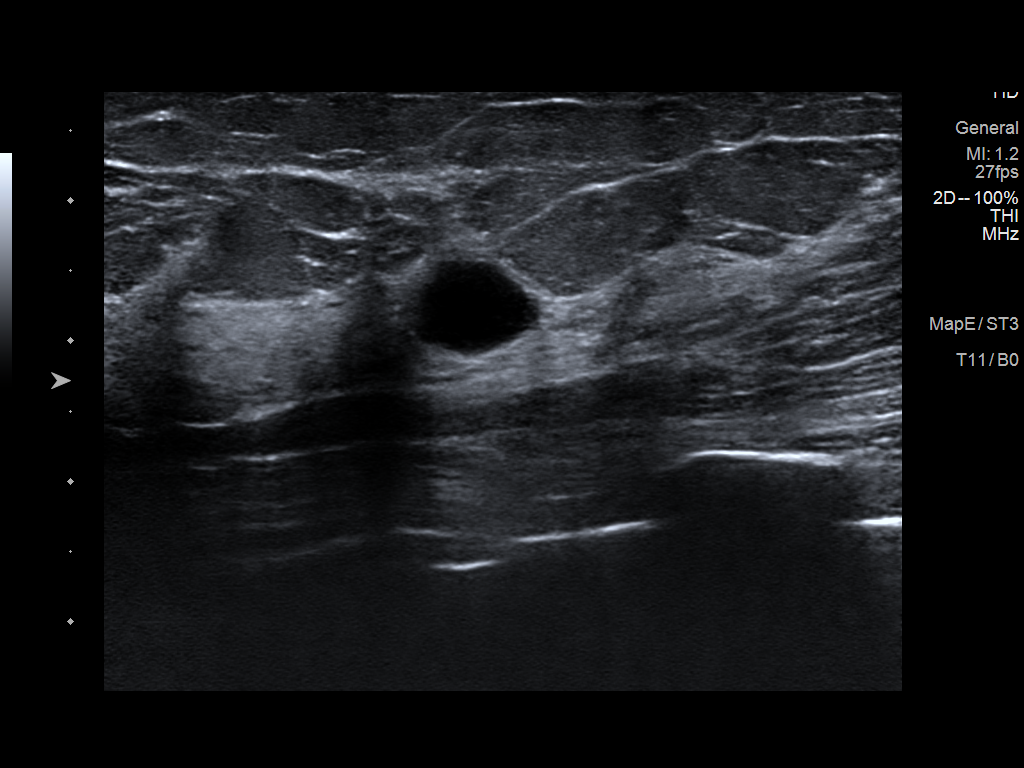
[im 7/7]
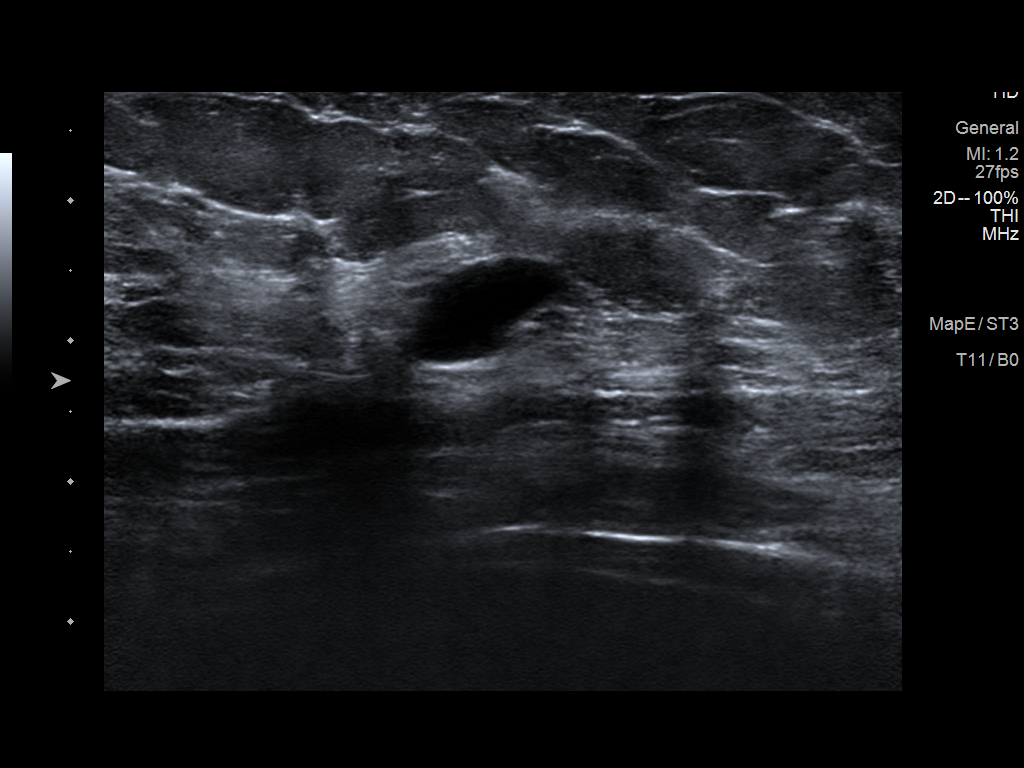

[7 of 7 positions shown; findings below may reference images not displayed]

ACR Breast Density Category b: There are scattered areas of
fibroglandular density.
FINDINGS: Tomosynthesis and synthesized spot-compression CC and MLO views of
the area of concern in the LEFT breast were obtained.

Spot compression images confirm a partially obscured isodense mass
in the OUTER breast at POSTERIOR depth whose visible margins are
circumscribed. The mass measures approximately 1.2 cm. There is no
associated architectural distortion or suspicious calcifications.

Targeted LEFT breast ultrasound is performed, showing a benign
simple cyst at the 3 o'clock position approximately 7 cm from the
nipple at MIDDLE to POSTERIOR depth measuring approximately 0.6 x
0.9 x 1.1 cm, demonstrating posterior acoustic enhancement and no
internal power Doppler flow, corresponding to the screening
mammographic finding. No suspicious solid mass or abnormal acoustic
shadowing is identified.
IMPRESSION: 1. No mammographic or sonographic evidence of malignancy involving
the LEFT breast.
2. Benign simple cyst in the OUTER breast at POSTERIOR depth which
accounts for the screening mammographic finding.

RECOMMENDATION:
Screening mammogram in one year.(Code:[5M])

I have discussed the findings and recommendations with the patient.
If applicable, a reminder letter will be sent to the patient
regarding the next appointment.

BI-RADS CATEGORY  2: Benign.

## 2020-08-02 ENCOUNTER — Other Ambulatory Visit: Payer: Self-pay

## 2020-08-02 ENCOUNTER — Encounter: Payer: Self-pay | Admitting: Obstetrics & Gynecology

## 2020-08-02 ENCOUNTER — Ambulatory Visit (INDEPENDENT_AMBULATORY_CARE_PROVIDER_SITE_OTHER): Payer: 59 | Admitting: Obstetrics & Gynecology

## 2020-08-02 VITALS — BP 122/74 | Ht 63.0 in | Wt 160.0 lb

## 2020-08-02 DIAGNOSIS — Z01419 Encounter for gynecological examination (general) (routine) without abnormal findings: Secondary | ICD-10-CM

## 2020-08-02 DIAGNOSIS — Z780 Asymptomatic menopausal state: Secondary | ICD-10-CM

## 2020-08-02 NOTE — Progress Notes (Signed)
    Sarahanne Novakowski 25-Nov-1960 [id number]   History:    59 y.o.  G0 Married. 2 adopted daughters, 25 and 24 yo.  UX:[id number] presenting for annual gyn exam   BJS:[id number], well on no hormone replacement therapy. No postmenopausal bleeding. No pelvic pain. No pain with intercourse. Urine and bowel movements normal. Breasts normal. Body mass index 28.34. Exercising regularly. Health labs with family physician. Colonoscopy 2019. Bone density normal in 2018.  Past medical history,surgical history, family history and social history were all reviewed and documented in the EPIC chart.  Gynecologic History No LMP recorded. (Menstrual status: Perimenopausal).  Obstetric History OB History  Gravida Para Term Preterm AB Living  0 0 0 0 0 0  SAB IAB Ectopic Multiple Live Births  0 0 0 0 0     ROS: A ROS was performed and pertinent positives and negatives are included in the history.  GENERAL: No fevers or chills. HEENT: No change in vision, no earache, sore throat or sinus congestion. NECK: No pain or stiffness. CARDIOVASCULAR: No chest pain or pressure. No palpitations. PULMONARY: No shortness of breath, cough or wheeze. GASTROINTESTINAL: No abdominal pain, nausea, vomiting or diarrhea, melena or bright red blood per rectum. GENITOURINARY: No urinary frequency, urgency, hesitancy or dysuria. MUSCULOSKELETAL: No joint or muscle pain, no back pain, no recent trauma. DERMATOLOGIC: No rash, no itching, no lesions. ENDOCRINE: No polyuria, polydipsia, no heat or cold intolerance. No recent change in weight. HEMATOLOGICAL: No anemia or easy bruising or bleeding. NEUROLOGIC: No headache, seizures, numbness, tingling or weakness. PSYCHIATRIC: No depression, no loss of interest in normal activity or change in sleep pattern.     Exam:   BP 140/80   Ht 5\' 3"  (1.6 m)   Wt 160 lb (72.6 kg)   BMI 28.34 kg/m   Body mass index is 28.34 kg/m.  General appearance : Well  developed well nourished female. No acute distress HEENT: Eyes: no retinal hemorrhage or exudates,  Neck supple, trachea midline, no carotid bruits, no thyroidmegaly Lungs: Clear to auscultation, no rhonchi or wheezes, or rib retractions  Heart: Regular rate and rhythm, no murmurs or gallops Breast:Examined in sitting and supine position were symmetrical in appearance, no palpable masses or tenderness,  no skin retraction, no nipple inversion, no nipple discharge, no skin discoloration, no axillary or supraclavicular lymphadenopathy Abdomen: no palpable masses or tenderness, no rebound or guarding Extremities: no edema or skin discoloration or tenderness  Pelvic: Vulva: Normal             Vagina: No gross lesions or discharge  Cervix: No gross lesions or discharge.  Pap reflex done.  Uterus  AV, normal size, shape and consistency, non-tender and mobile  Adnexa  Without masses or tenderness  Anus: Normal   Assessment/Plan:  59 y.o. female for annual exam   1. Encounter for routine gynecological examination with Papanicolaou smear of cervix Normal gynecologic exam in menopause.  Pap reflex done.  Breast exam normal.  Screening mammogram negative in March 2021.  Colonoscopy 2019.  Body mass index 28.34.  Continue with fitness and healthy nutrition.  Health labs with family physician.  2. Post-menopausal Well on no hormone replacement therapy.  No PMB.  BD in 2018 was normal.    2019 MD, 4:16 PM 08/02/2020

## 2020-08-02 NOTE — Addendum Note (Signed)
Addended by: Berna Spare A on: 08/02/2020 04:37 PM   Modules accepted: Orders

## 2020-08-03 LAB — PAP IG W/ RFLX HPV ASCU

## 2020-09-24 ENCOUNTER — Other Ambulatory Visit (HOSPITAL_BASED_OUTPATIENT_CLINIC_OR_DEPARTMENT_OTHER): Payer: Self-pay | Admitting: Internal Medicine

## 2020-09-24 DIAGNOSIS — Z1231 Encounter for screening mammogram for malignant neoplasm of breast: Secondary | ICD-10-CM

## 2020-10-04 ENCOUNTER — Other Ambulatory Visit: Payer: Self-pay

## 2020-10-04 ENCOUNTER — Ambulatory Visit (HOSPITAL_BASED_OUTPATIENT_CLINIC_OR_DEPARTMENT_OTHER)
Admission: RE | Admit: 2020-10-04 | Discharge: 2020-10-04 | Disposition: A | Discharge status: Home / Self Care | Payer: PRIVATE HEALTH INSURANCE | Source: Ambulatory Visit | Attending: Internal Medicine | Admitting: Internal Medicine

## 2020-10-04 ENCOUNTER — Encounter (HOSPITAL_BASED_OUTPATIENT_CLINIC_OR_DEPARTMENT_OTHER): Payer: Self-pay

## 2020-10-04 DIAGNOSIS — Z1231 Encounter for screening mammogram for malignant neoplasm of breast: Secondary | ICD-10-CM | POA: Diagnosis not present

## 2020-10-04 IMAGING — MG MM DIGITAL SCREENING BILAT W/ TOMO AND CAD
8 series · 8 of 24 positions shown · non-contrast
Comparison: Previous exam(s).

CLINICAL DATA: Screening.

EXAM:
DIGITAL SCREENING BILATERAL MAMMOGRAM WITH TOMOSYNTHESIS AND CAD
TECHNIQUE: Bilateral screening digital craniocaudal and mediolateral oblique
mammograms were obtained. Bilateral screening digital breast
tomosynthesis was performed. The images were evaluated with
computer-aided detection.

[R MLO synth-2D]
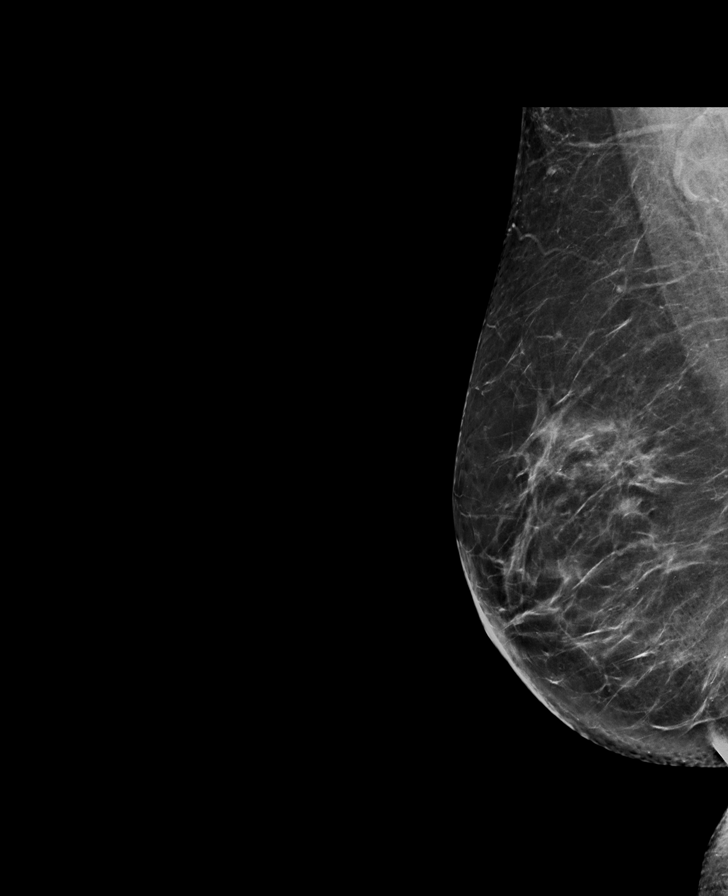

[R CC synth-2D]
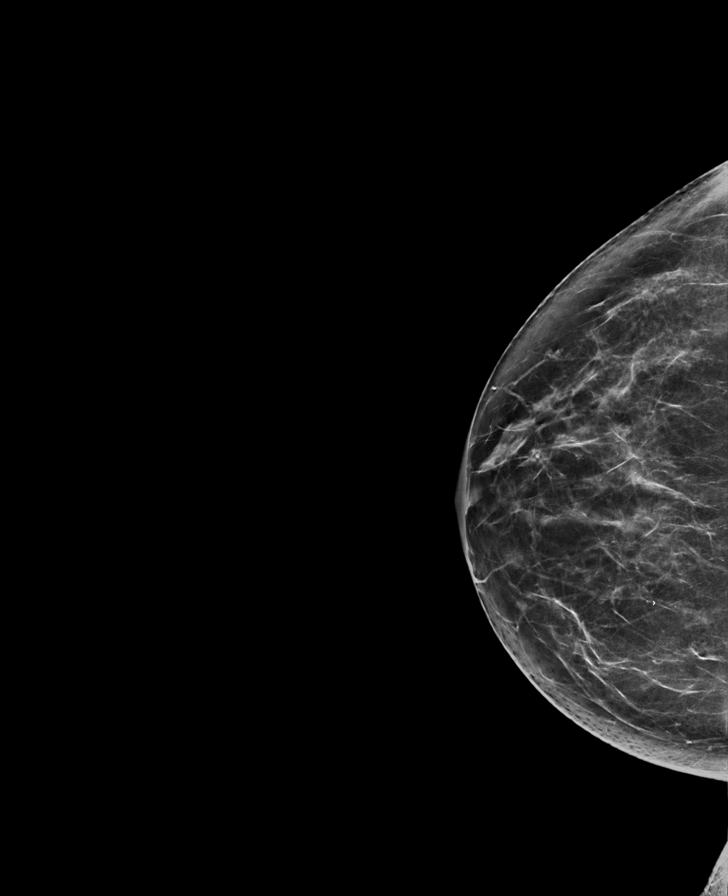

[L MLO synth-2D]
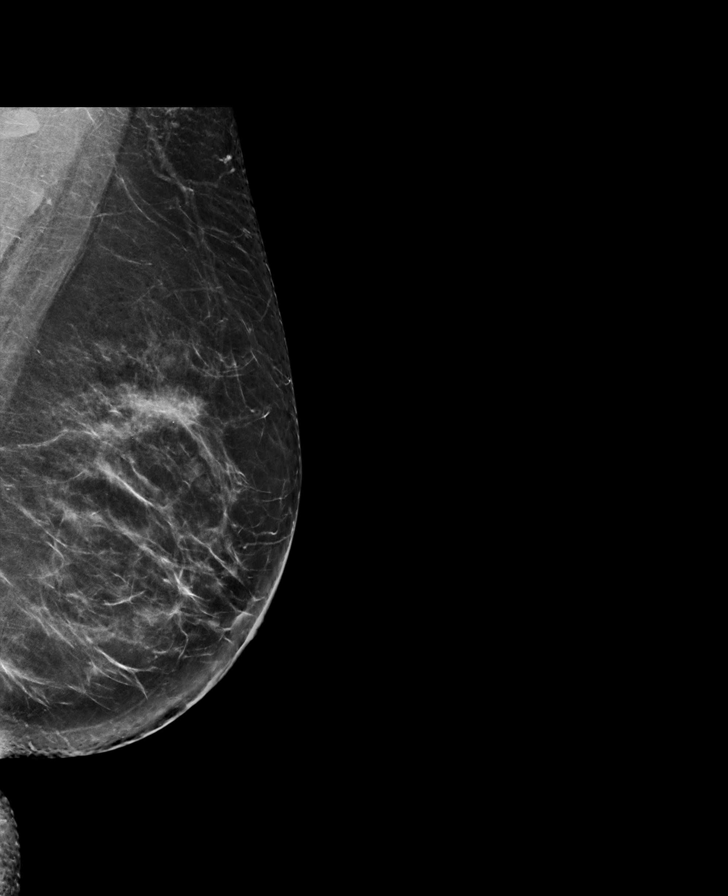

[L CC synth-2D]
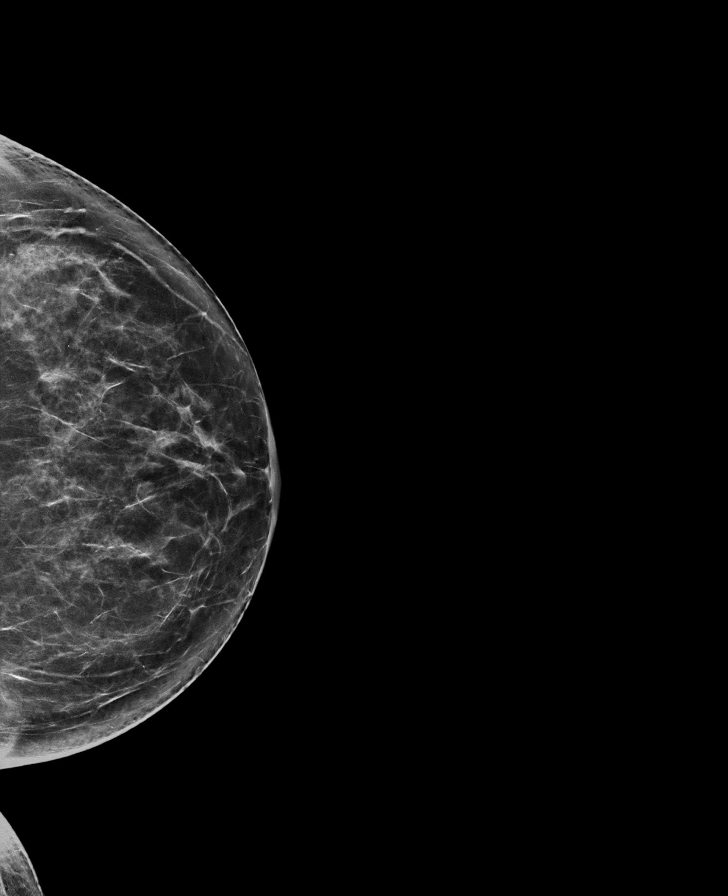

[R CC tomo · tomo slice 37/72.0]
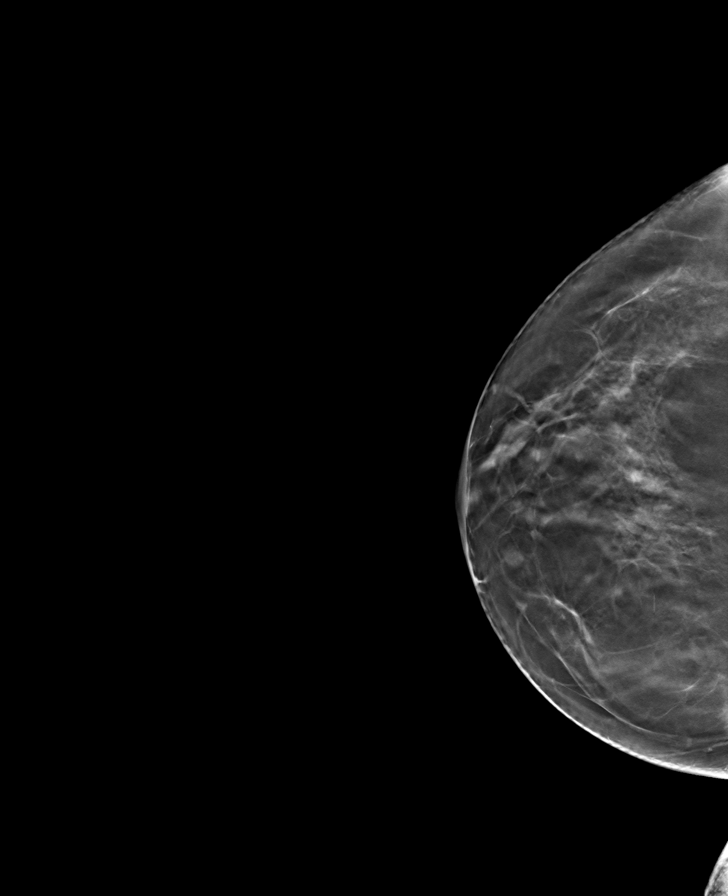

[L MLO tomo · tomo slice 42/83.0]
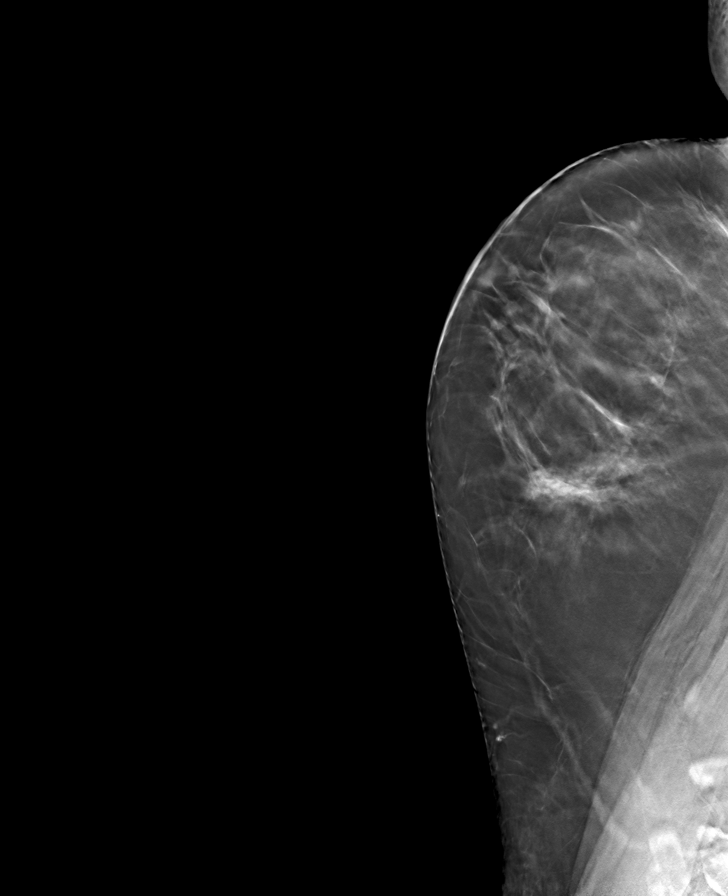

[L CC tomo · tomo slice 39/76.0]
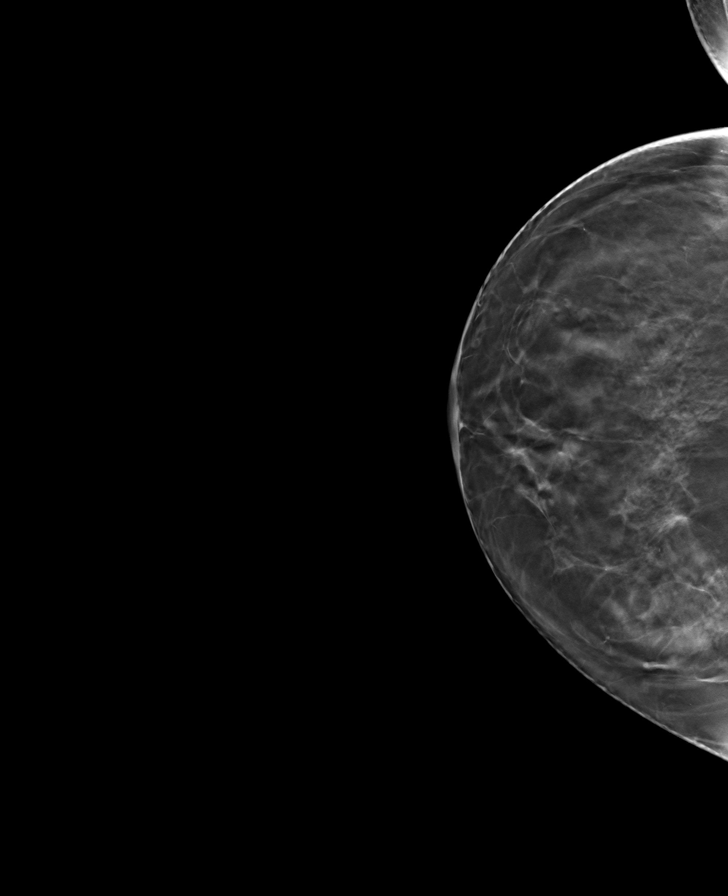

[R MLO tomo · tomo slice 41/80.0]
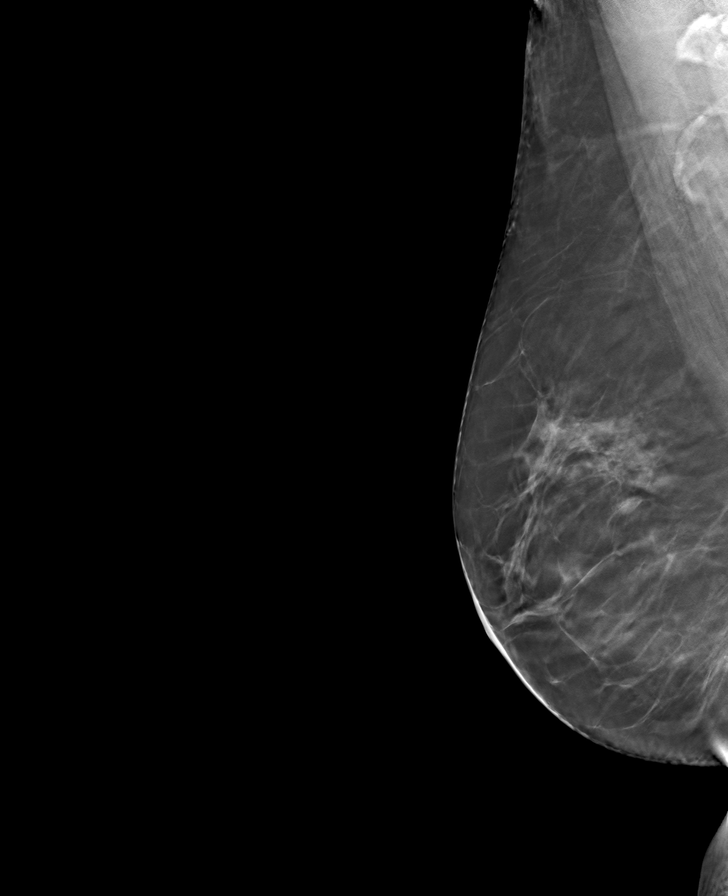

[8 of 24 positions shown; findings below may reference images not displayed]

ACR Breast Density Category b: There are scattered areas of
fibroglandular density.
FINDINGS: There are no findings suspicious for malignancy.
IMPRESSION: No mammographic evidence of malignancy. A result letter of this
screening mammogram will be mailed directly to the patient.

RECOMMENDATION:
Screening mammogram in one year. (Code:[BY])

BI-RADS CATEGORY  1: Negative.

## 2021-08-03 ENCOUNTER — Ambulatory Visit: Payer: Self-pay | Admitting: Obstetrics & Gynecology

## 2021-08-25 ENCOUNTER — Encounter: Payer: Self-pay | Admitting: Obstetrics & Gynecology

## 2021-08-25 ENCOUNTER — Ambulatory Visit (INDEPENDENT_AMBULATORY_CARE_PROVIDER_SITE_OTHER): Payer: 59 | Admitting: Obstetrics & Gynecology

## 2021-08-25 ENCOUNTER — Other Ambulatory Visit: Payer: Self-pay

## 2021-08-25 VITALS — BP 128/74 | HR 62 | Resp 18 | Ht 62.8 in | Wt 142.2 lb

## 2021-08-25 DIAGNOSIS — Z780 Asymptomatic menopausal state: Secondary | ICD-10-CM

## 2021-08-25 DIAGNOSIS — Z01419 Encounter for gynecological examination (general) (routine) without abnormal findings: Secondary | ICD-10-CM

## 2021-08-25 NOTE — Progress Notes (Signed)
Candice Lee 1961-07-11 [id number]   History:    61 y.o. G0 Married.  2 adopted daughters, 48 and 83 yo.   RP:  Established patient presenting for annual gyn exam    HPI: Postmenopause, well on no hormone replacement therapy.  No postmenopausal bleeding.  No pelvic pain.  No pain with intercourse.  Pap Neg 07/2020.  Urine and bowel movements normal. Breasts normal. Mammo Neg 09/2020.  Body mass index 25.35.  Exercising regularly.  Health labs with family physician.  Colonoscopy 2019.  Bone density normal in 2018.   Past medical history,surgical history, family history and social history were all reviewed and documented in the EPIC chart.  Gynecologic History No LMP recorded. (Menstrual status: Perimenopausal).  Obstetric History OB History  Gravida Para Term Preterm AB Living  0 0 0 0 0 0  SAB IAB Ectopic Multiple Live Births  0 0 0 0 0     ROS: A ROS was performed and pertinent positives and negatives are included in the history. GENERAL: No fevers or chills. HEENT: No change in vision, no earache, sore throat or sinus congestion. NECK: No pain or stiffness. CARDIOVASCULAR: No chest pain or pressure. No palpitations. PULMONARY: No shortness of breath, cough or wheeze. GASTROINTESTINAL: No abdominal pain, nausea, vomiting or diarrhea, melena or bright red blood per rectum. GENITOURINARY: No urinary frequency, urgency, hesitancy or dysuria. MUSCULOSKELETAL: No joint or muscle pain, no back pain, no recent trauma. DERMATOLOGIC: No rash, no itching, no lesions. ENDOCRINE: No polyuria, polydipsia, no heat or cold intolerance. No recent change in weight. HEMATOLOGICAL: No anemia or easy bruising or bleeding. NEUROLOGIC: No headache, seizures, numbness, tingling or weakness. PSYCHIATRIC: No depression, no loss of interest in normal activity or change in sleep pattern.     Exam:   BP 128/74    Pulse 62    Resp 18    Ht 5' 2.8" (1.595 m)    Wt 142 lb 3.2 oz (64.5 kg)    SpO2 99%     BMI 25.35 kg/m   Body mass index is 25.35 kg/m.  General appearance : Well developed well nourished female. No acute distress HEENT: Eyes: no retinal hemorrhage or exudates,  Neck supple, trachea midline, no carotid bruits, no thyroidmegaly Lungs: Clear to auscultation, no rhonchi or wheezes, or rib retractions  Heart: Regular rate and rhythm, no murmurs or gallops Breast:Examined in sitting and supine position were symmetrical in appearance, no palpable masses or tenderness,  no skin retraction, no nipple inversion, no nipple discharge, no skin discoloration, no axillary or supraclavicular lymphadenopathy Abdomen: no palpable masses or tenderness, no rebound or guarding Extremities: no edema or skin discoloration or tenderness  Pelvic: Vulva: Normal             Vagina: No gross lesions or discharge  Cervix: No gross lesions or discharge  Uterus  AV, normal size, shape and consistency, non-tender and mobile  Adnexa  Without masses or tenderness  Anus: Normal   Assessment/Plan:  61 y.o. female for annual exam   1. Well female exam with routine gynecological exam Postmenopause, well on no hormone replacement therapy.  No postmenopausal bleeding.  No pelvic pain.  No pain with intercourse.  Pap Neg 07/2020.  Urine and bowel movements normal. Breasts normal. Mammo Neg 09/2020.  Body mass index 25.35.  Exercising regularly.  Health labs with family physician.  Colonoscopy 2019.  Bone density normal in 2018.  2. Post-menopausal  Postmenopause, well on no hormone replacement therapy.  No postmenopausal bleeding.  No pelvic pain.  No pain with intercourse.  BD normal in 2018.  Will repeat BD at age 77.  Princess Bruins MD, 12:05 PM 08/25/2021

## 2021-09-26 ENCOUNTER — Other Ambulatory Visit: Payer: Self-pay | Admitting: Internal Medicine

## 2021-09-26 DIAGNOSIS — Z1231 Encounter for screening mammogram for malignant neoplasm of breast: Secondary | ICD-10-CM

## 2021-10-05 ENCOUNTER — Ambulatory Visit
Admission: RE | Admit: 2021-10-05 | Discharge: 2021-10-05 | Disposition: A | Discharge status: Home / Self Care | Payer: PRIVATE HEALTH INSURANCE | Source: Ambulatory Visit | Attending: Internal Medicine | Admitting: Internal Medicine

## 2021-10-05 DIAGNOSIS — Z1231 Encounter for screening mammogram for malignant neoplasm of breast: Secondary | ICD-10-CM

## 2021-11-18 ENCOUNTER — Other Ambulatory Visit: Payer: Self-pay | Admitting: Ophthalmology

## 2021-11-18 DIAGNOSIS — H0520 Unspecified exophthalmos: Secondary | ICD-10-CM

## 2021-11-18 DIAGNOSIS — H4922 Sixth [abducent] nerve palsy, left eye: Secondary | ICD-10-CM

## 2021-11-24 ENCOUNTER — Other Ambulatory Visit: Payer: Self-pay | Admitting: Ophthalmology

## 2021-11-24 DIAGNOSIS — H4922 Sixth [abducent] nerve palsy, left eye: Secondary | ICD-10-CM

## 2021-11-24 DIAGNOSIS — H0520 Unspecified exophthalmos: Secondary | ICD-10-CM

## 2021-12-12 ENCOUNTER — Ambulatory Visit
Admission: RE | Admit: 2021-12-12 | Discharge: 2021-12-12 | Disposition: A | Discharge status: Home / Self Care | Payer: PRIVATE HEALTH INSURANCE | Source: Ambulatory Visit | Attending: Ophthalmology | Admitting: Ophthalmology

## 2021-12-12 DIAGNOSIS — H4922 Sixth [abducent] nerve palsy, left eye: Secondary | ICD-10-CM

## 2021-12-12 DIAGNOSIS — H0520 Unspecified exophthalmos: Secondary | ICD-10-CM

## 2021-12-12 IMAGING — MR MR HEAD WO/W CM
9 series · 48 of 48 positions shown · IV contrast (multihance)
Comparison: None.

CLINICAL DATA: Exophthalmos in 6 nerve palsy affecting the left
eye. Double vision for 1 year

EXAM:
MRI HEAD AND ORBITS WITHOUT AND WITH CONTRAST
TECHNIQUE: Multiplanar, multiecho pulse sequences of the brain and surrounding
structures were obtained without and with intravenous contrast.
Multiplanar, multiecho pulse sequences of the orbits and surrounding
structures were obtained including fat saturation techniques, before
and after intravenous contrast administration.
CONTRAST:  12mL MULTIHANCE GADOBENATE DIMEGLUMINE 529 MG/ML IV SOLN

[Series 2: T1 · sagittal · 5.0mm · 0.45mm/px · 3 of 22 slices shown]
[im 1/22]
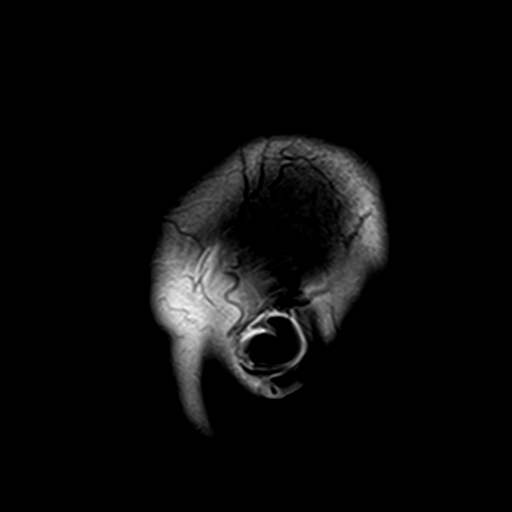
[im 11/22]
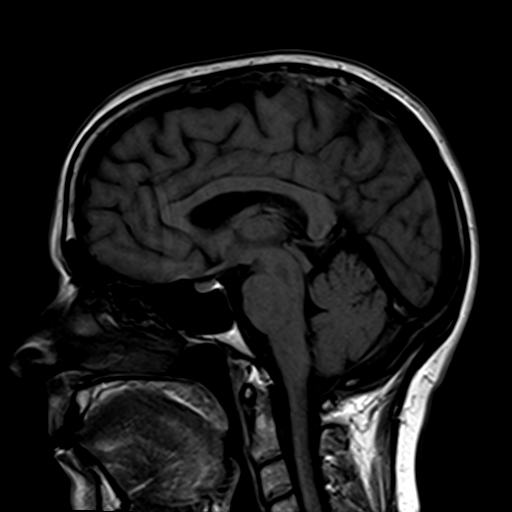
[im 22/22]
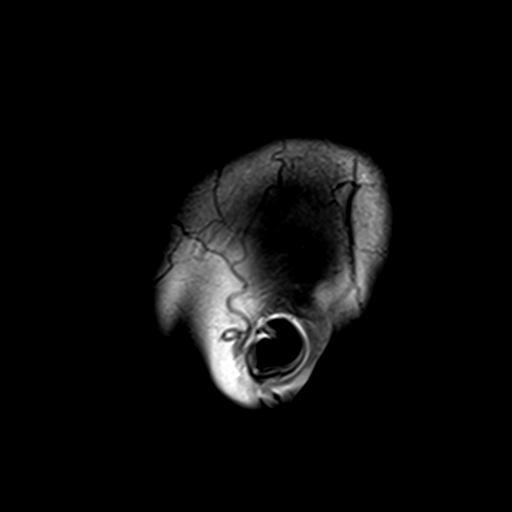

[Series 3: DWI · axial · 3.0mm · 1.80mm/px · z∈[-46,+100]mm · 12 of 100 slices shown (1 of 4)]
[im 1/100]
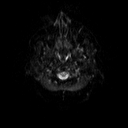
[im 10/100]
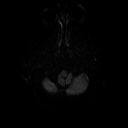
[im 19/100]
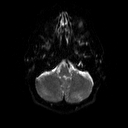
[im 28/100]
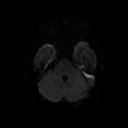
[im 37/100]
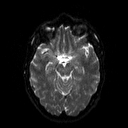
[im 46/100]
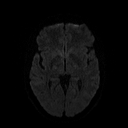
[im 55/100]
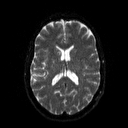
[im 64/100]
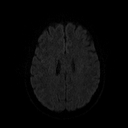
[im 73/100]
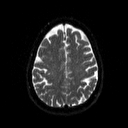
[im 82/100]
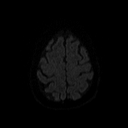
[im 91/100]
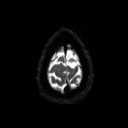
[im 100/100]
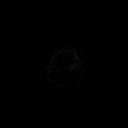

[Series 4: DWI · axial · 3.0mm · 1.80mm/px · z∈[-46,+100]mm · 6 of 49 slices shown (2 of 4)]
[im 1/49]
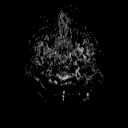
[im 10/49]
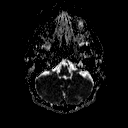
[im 20/49]
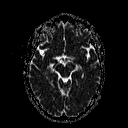
[im 29/49]
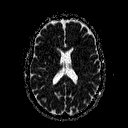
[im 39/49]
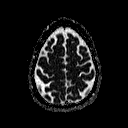
[im 49/49]
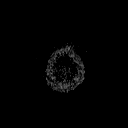

[Series 5: DWI · coronal · 5.0mm · 1.80mm/px · 8 of 66 slices shown (3 of 4)]
[im 1/66]
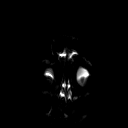
[im 10/66]
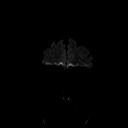
[im 19/66]
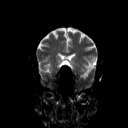
[im 28/66]
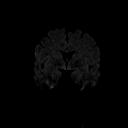
[im 38/66]
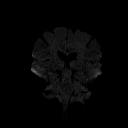
[im 47/66]
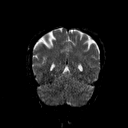
[im 56/66]
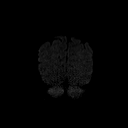
[im 66/66]
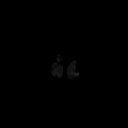

[Series 6: DWI · coronal · 5.0mm · 1.80mm/px · 4 of 34 slices shown (4 of 4)]
[im 1/34]
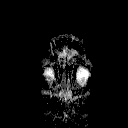
[im 12/34]
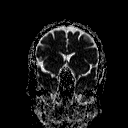
[im 23/34]
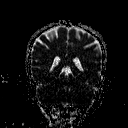
[im 34/34]
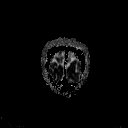

[Series 7: T2 · axial · 5.0mm · 0.60mm/px · z∈[-44,+97]mm · 3 of 22 slices shown]
[im 1/22]
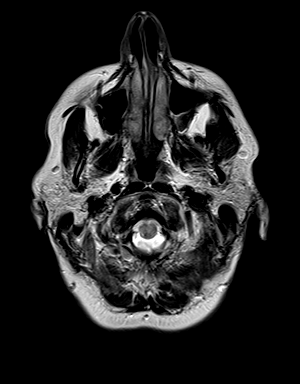
[im 11/22]
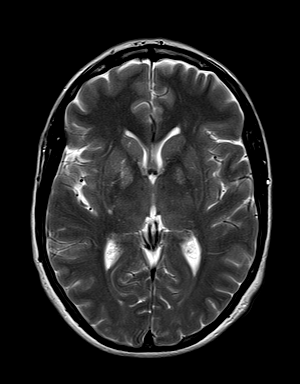
[im 22/22]
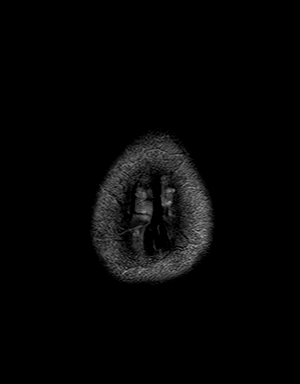

[Series 8: FLAIR · axial · 3.0mm · 0.45mm/px · z∈[-40,+94]mm · 4 of 30 slices shown]
[im 1/30]
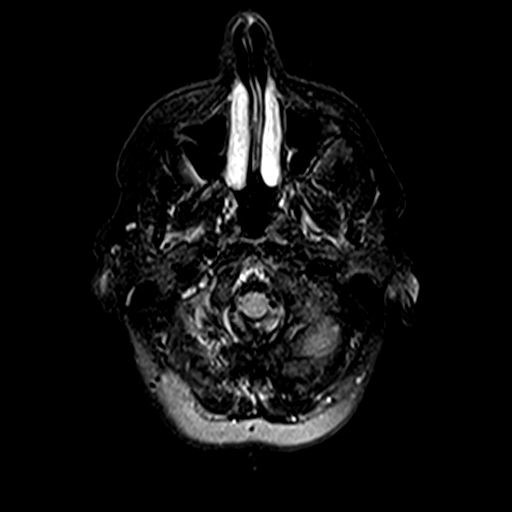
[im 10/30]
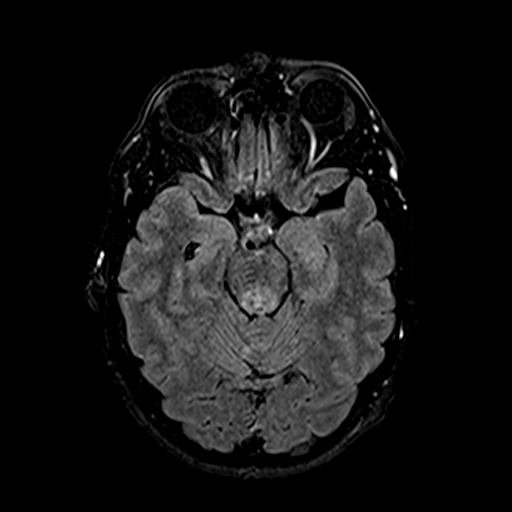
[im 20/30]
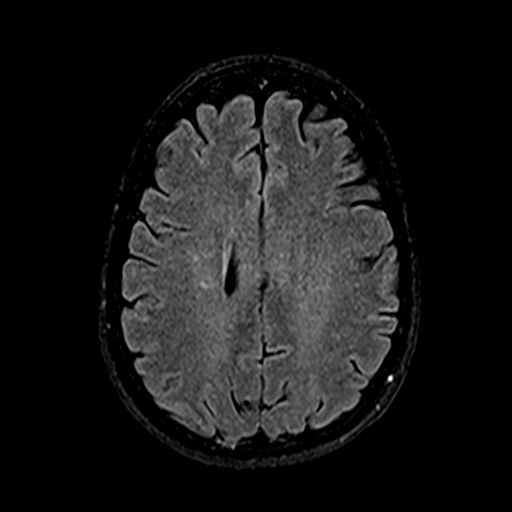
[im 30/30]
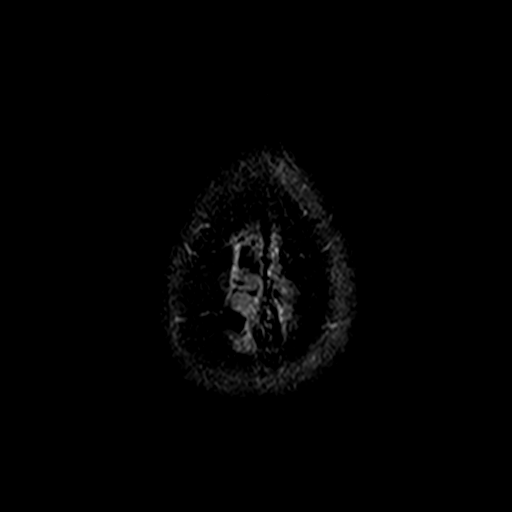

[Series 9: mip_images(sw) · axial · 32.0mm · 0.90mm/px · z∈[-29,+83]mm · 4 of 29 slices shown]
[im 1/29]
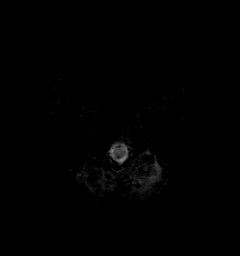
[im 10/29]
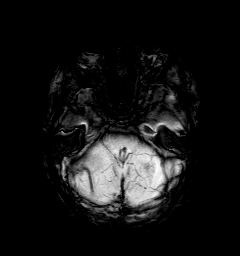
[im 19/29]
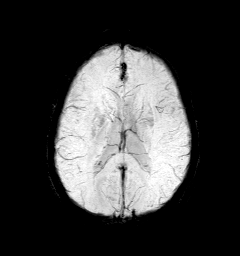
[im 29/29]
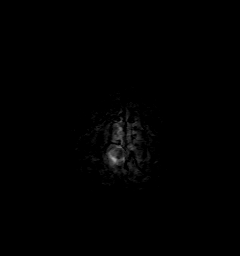

[Series 10: swi_images · axial · 4.0mm · 0.90mm/px · z∈[-43,+97]mm · 4 of 36 slices shown]
[im 1/36]
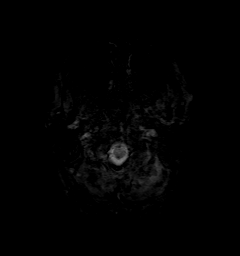
[im 12/36]
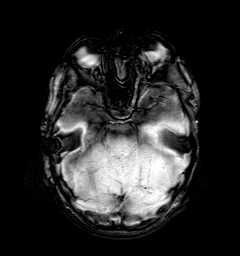
[im 24/36]
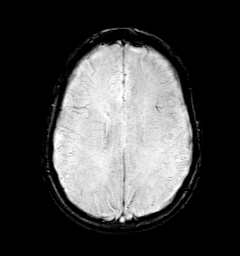
[im 36/36]
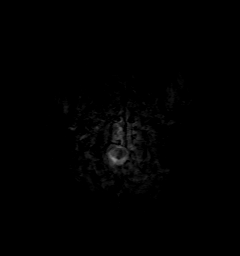

[48 of 48 positions shown; findings below may reference images not displayed]

FINDINGS: MRI HEAD FINDINGS

Brain: No infarction, hemorrhage, hydrocephalus, extra-axial
collection or mass lesion. Normal appearance of the brainstem and
cisterns. No signs of demyelinating disease. Unremarkable cavernous
sinus region and retro clival venous plexus with traversing 6
nerves.

Vascular: Normal flow voids and vascular enhancements.

Skull and upper cervical spine: Normal marrow signal

MRI ORBITS FINDINGS

Orbits: Symmetric normal appearance of the globes, optic nerve
sheath complexes, lacrimal glands, and extraocular muscles. Normal
appearance of the orbital fat and superior ophthalmic veins.

Visualized sinuses: Clear

Soft tissues: Negative for mass or inflammatory changes.
IMPRESSION: Negative MRI of the brain and orbits.  No explanation for symptoms.

## 2021-12-12 IMAGING — MR MR ORBITS WO/W CM
10 of 12 series · 22 of 48 positions shown · IV contrast (multihance)
Comparison: None.

CLINICAL DATA: Exophthalmos in 6 nerve palsy affecting the left
eye. Double vision for 1 year

EXAM:
MRI HEAD AND ORBITS WITHOUT AND WITH CONTRAST
TECHNIQUE: Multiplanar, multiecho pulse sequences of the brain and surrounding
structures were obtained without and with intravenous contrast.
Multiplanar, multiecho pulse sequences of the orbits and surrounding
structures were obtained including fat saturation techniques, before
and after intravenous contrast administration.
CONTRAST:  12mL MULTIHANCE GADOBENATE DIMEGLUMINE 529 MG/ML IV SOLN

[Series 1: T2 · coronal · 5.0mm · 0.45mm/px · 3 of 29 slices shown]
[im 1/29]
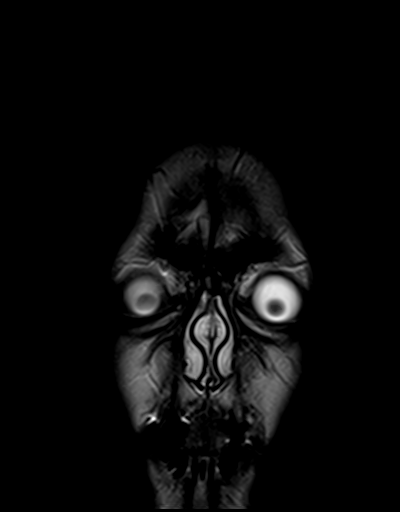
[im 15/29]
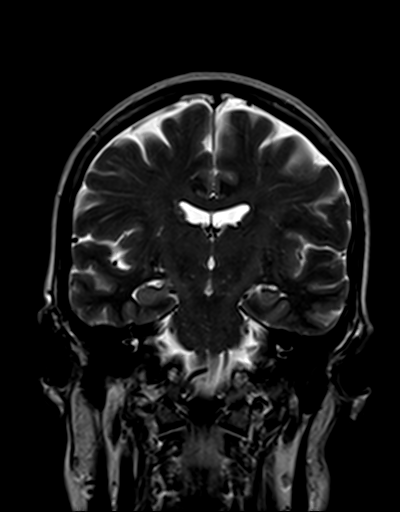
[im 29/29]
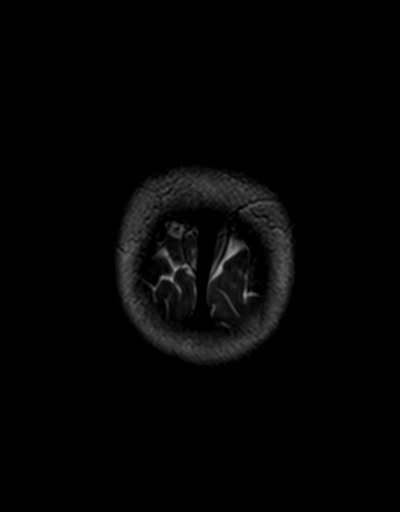

[Series 2: t1_mpr_tra copy center · axial · 1.0mm · 0.45mm/px · z∈[-64,+9]mm · 4 of 160 slices shown]
[im 1/160]
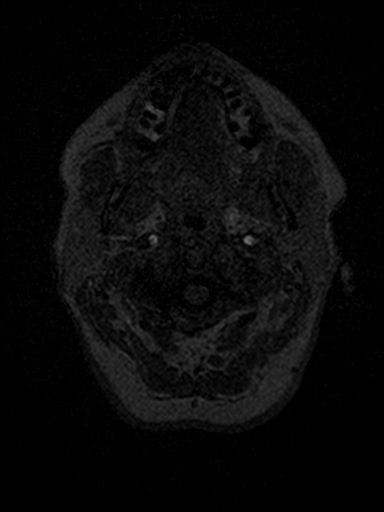
[im 25/160]
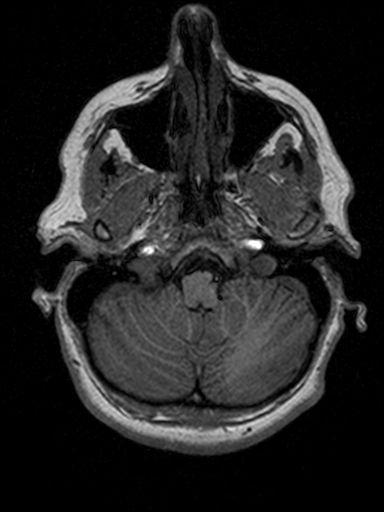
[im 49/160]
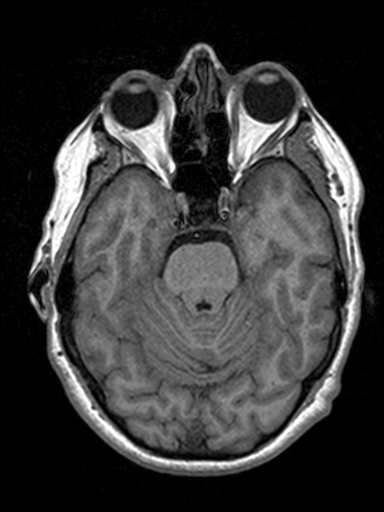
[im 74/160]
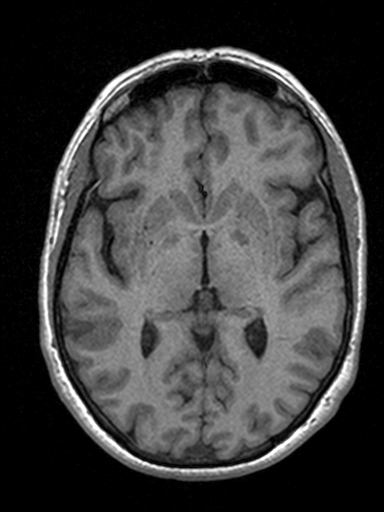

[Series 3: T1 · coronal · 3.0mm · 0.35mm/px · 2 of 22 slices shown (1 of 2)]
[im 1/22]
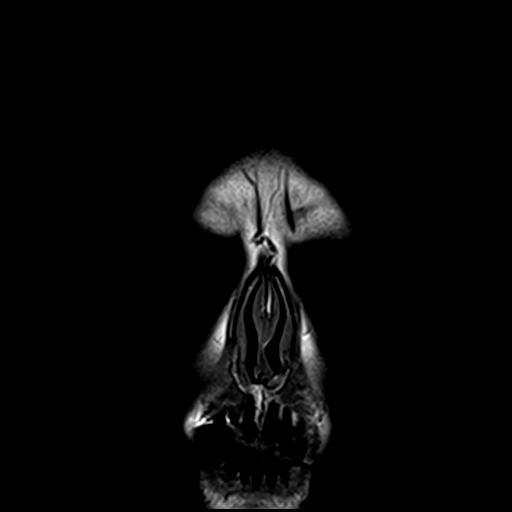
[im 22/22]
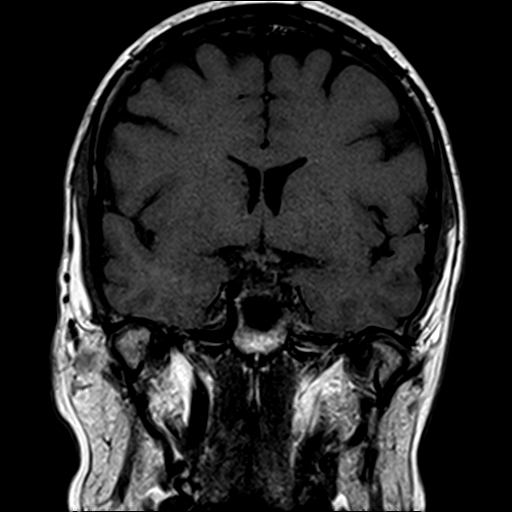

[Series 4: T1 · axial · 3.0mm · 0.35mm/px · 1 of 15 slices shown (2 of 2)]
[im 1/15]
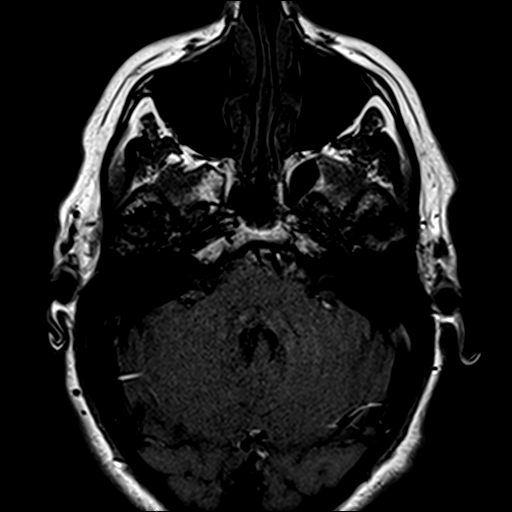

[Series 5: T2 fat-sat · coronal · 3.0mm · 0.35mm/px · 2 of 22 slices shown (1 of 2)]
[im 1/22]
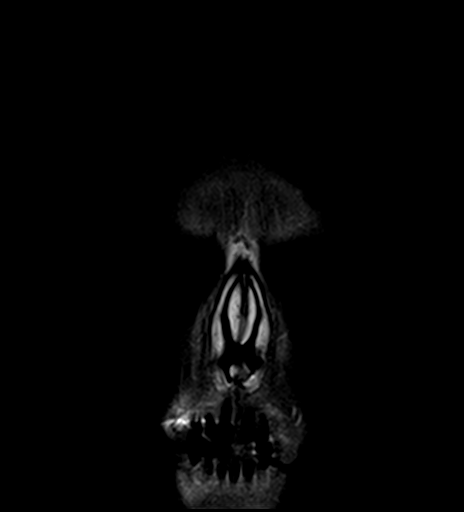
[im 22/22]
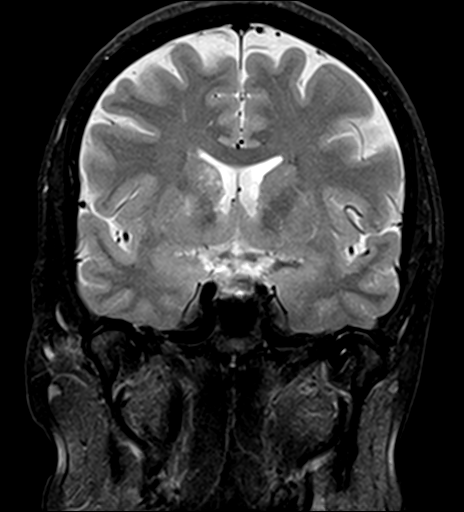

[Series 6: T2 fat-sat · axial · 3.0mm · 0.35mm/px · 1 of 15 slices shown (2 of 2)]
[im 1/15]
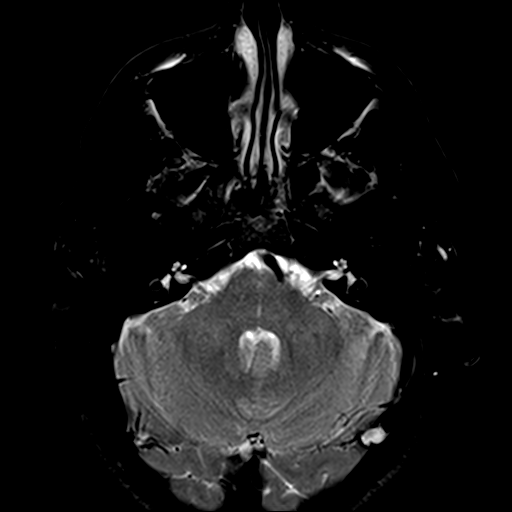

[Series 8: T1 post-contrast · coronal · 5.0mm · 0.45mm/px · 3 of 29 slices shown]
[im 1/29]
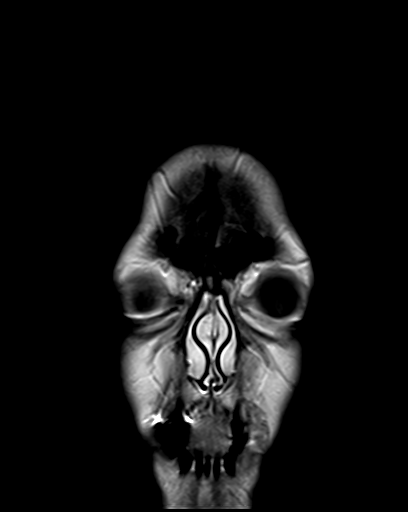
[im 15/29]
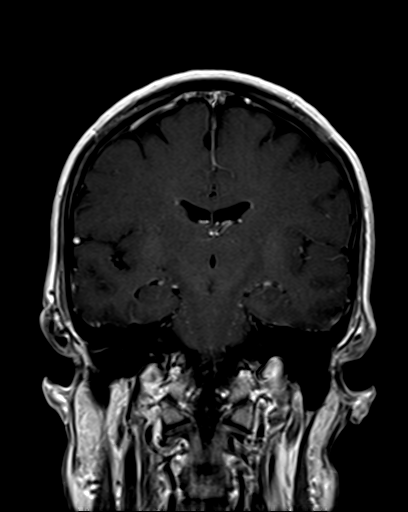
[im 29/29]
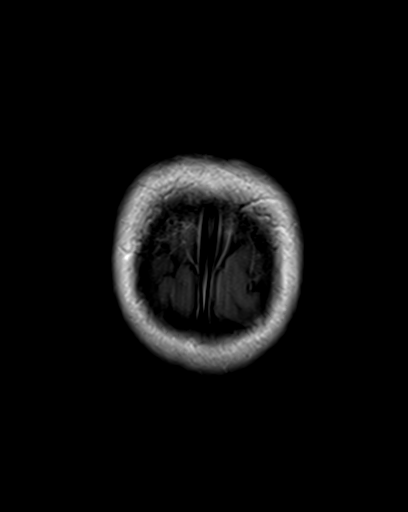

[Series 9: T1 fat-sat post-contrast · coronal · 3.0mm · 0.35mm/px · 2 of 22 slices shown (1 of 2)]
[im 1/22]
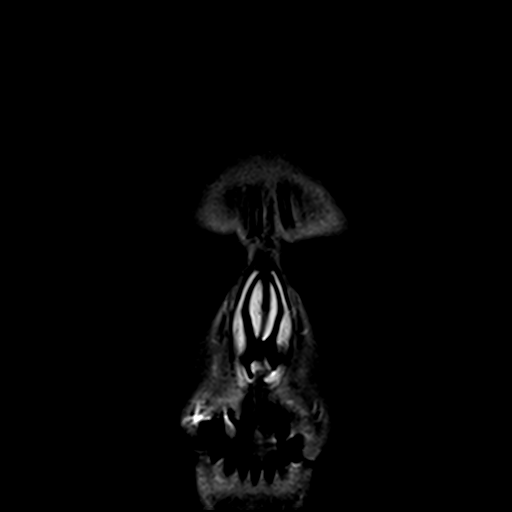
[im 22/22]
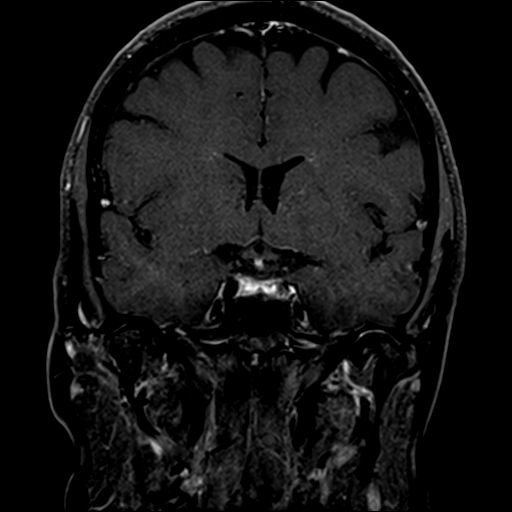

[Series 10: T1 fat-sat post-contrast · axial · 3.0mm · 0.35mm/px · 1 of 15 slices shown (2 of 2)]
[im 1/15]
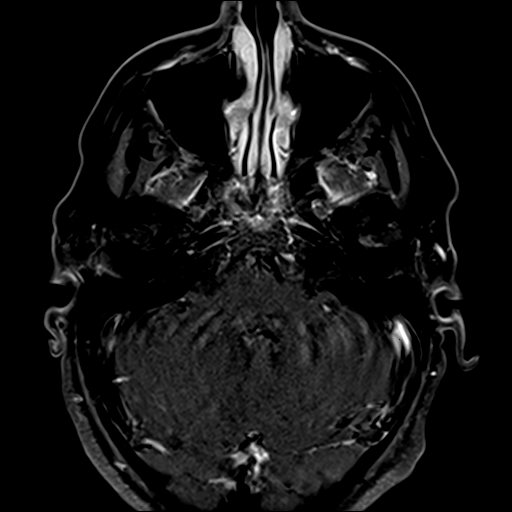

[Series 12: T2 post-contrast · coronal · 5.0mm · 0.45mm/px · 3 of 29 slices shown]
[im 1/29]
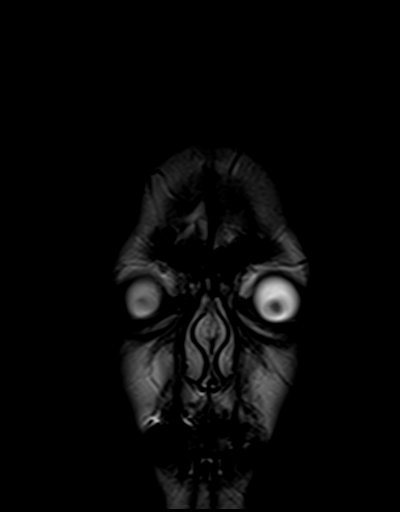
[im 15/29]
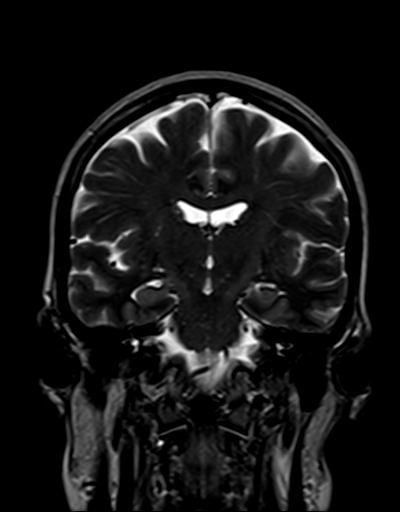
[im 29/29]
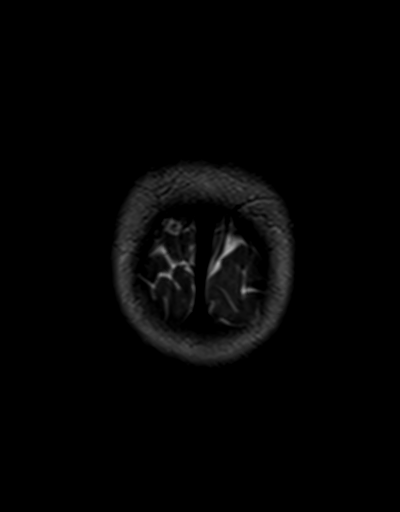

[22 of 48 positions shown; findings below may reference images not displayed]

FINDINGS: MRI HEAD FINDINGS

Brain: No infarction, hemorrhage, hydrocephalus, extra-axial
collection or mass lesion. Normal appearance of the brainstem and
cisterns. No signs of demyelinating disease. Unremarkable cavernous
sinus region and retro clival venous plexus with traversing 6
nerves.

Vascular: Normal flow voids and vascular enhancements.

Skull and upper cervical spine: Normal marrow signal

MRI ORBITS FINDINGS

Orbits: Symmetric normal appearance of the globes, optic nerve
sheath complexes, lacrimal glands, and extraocular muscles. Normal
appearance of the orbital fat and superior ophthalmic veins.

Visualized sinuses: Clear

Soft tissues: Negative for mass or inflammatory changes.
IMPRESSION: Negative MRI of the brain and orbits.  No explanation for symptoms.

## 2021-12-12 MED ORDER — GADOBENATE DIMEGLUMINE 529 MG/ML IV SOLN
12.0000 mL | Freq: Once | INTRAVENOUS | Status: AC | PRN
  Administered 2021-12-12: 12 mL via INTRAVENOUS

## 2022-07-13 ENCOUNTER — Other Ambulatory Visit: Payer: Self-pay | Admitting: Internal Medicine

## 2022-07-13 DIAGNOSIS — Z1231 Encounter for screening mammogram for malignant neoplasm of breast: Secondary | ICD-10-CM

## 2022-08-25 ENCOUNTER — Ambulatory Visit: Payer: 59 | Admitting: Obstetrics & Gynecology

## 2022-08-29 ENCOUNTER — Ambulatory Visit (INDEPENDENT_AMBULATORY_CARE_PROVIDER_SITE_OTHER): Payer: 59 | Admitting: Obstetrics & Gynecology

## 2022-08-29 ENCOUNTER — Encounter: Payer: Self-pay | Admitting: Obstetrics & Gynecology

## 2022-08-29 VITALS — BP 112/68 | HR 62 | Ht 62.75 in | Wt 143.0 lb

## 2022-08-29 DIAGNOSIS — Z780 Asymptomatic menopausal state: Secondary | ICD-10-CM

## 2022-08-29 DIAGNOSIS — Z01419 Encounter for gynecological examination (general) (routine) without abnormal findings: Secondary | ICD-10-CM

## 2022-08-29 NOTE — Progress Notes (Signed)
Candice Lee Dec 31, 1960 [id number]   History:    61 y.o. G0 Married.  2 adopted daughters, 57 and 89 yo.   RP:  Established patient presenting for annual gyn exam    HPI: Postmenopause, well on no hormone replacement therapy.  No postmenopausal bleeding.  No pelvic pain.  No pain with intercourse.  Pap Neg 07/2020.  Will repeat Pap next year.  Urine and bowel movements normal. Breasts normal. Mammo Neg 09/2021, scheduled for 09/2022.  Body mass index 25.53.  Exercising regularly.  Health labs with family physician.  Colonoscopy 2019.  Bone density normal in 2018.  Flu vaccine at pharmacy.  Past medical history,surgical history, family history and social history were all reviewed and documented in the EPIC chart.  Gynecologic History Patient's last menstrual period was 05/16/2013.  Obstetric History OB History  Gravida Para Term Preterm AB Living  0 0 0 0 0 0  SAB IAB Ectopic Multiple Live Births  0 0 0 0 0     ROS: A ROS was performed and pertinent positives and negatives are included in the history. GENERAL: No fevers or chills. HEENT: No change in vision, no earache, sore throat or sinus congestion. NECK: No pain or stiffness. CARDIOVASCULAR: No chest pain or pressure. No palpitations. PULMONARY: No shortness of breath, cough or wheeze. GASTROINTESTINAL: No abdominal pain, nausea, vomiting or diarrhea, melena or bright red blood per rectum. GENITOURINARY: No urinary frequency, urgency, hesitancy or dysuria. MUSCULOSKELETAL: No joint or muscle pain, no back pain, no recent trauma. DERMATOLOGIC: No rash, no itching, no lesions. ENDOCRINE: No polyuria, polydipsia, no heat or cold intolerance. No recent change in weight. HEMATOLOGICAL: No anemia or easy bruising or bleeding. NEUROLOGIC: No headache, seizures, numbness, tingling or weakness. PSYCHIATRIC: No depression, no loss of interest in normal activity or change in sleep pattern.     Exam:   BP 112/68   Pulse 62   Ht 5' 2.75"  (1.594 m)   Wt 143 lb (64.9 kg)   LMP 05/16/2013   SpO2 97%   BMI 25.53 kg/m   Body mass index is 25.53 kg/m.  General appearance : Well developed well nourished female. No acute distress HEENT: Eyes: no retinal hemorrhage or exudates,  Neck supple, trachea midline, no carotid bruits, no thyroidmegaly Lungs: Clear to auscultation, no rhonchi or wheezes, or rib retractions  Heart: Regular rate and rhythm, no murmurs or gallops Breast:Examined in sitting and supine position were symmetrical in appearance, no palpable masses or tenderness,  no skin retraction, no nipple inversion, no nipple discharge, no skin discoloration, no axillary or supraclavicular lymphadenopathy Abdomen: no palpable masses or tenderness, no rebound or guarding Extremities: no edema or skin discoloration or tenderness  Pelvic: Vulva: Normal             Vagina: No gross lesions or discharge  Cervix: No gross lesions or discharge  Uterus  AV, normal size, shape and consistency, non-tender and mobile  Adnexa  Without masses or tenderness  Anus: Normal   Assessment/Plan:  62 y.o. female for annual exam   1. Well female exam with routine gynecological exam Postmenopause, well on no hormone replacement therapy.  No postmenopausal bleeding.  No pelvic pain.  No pain with intercourse.  Pap Neg 07/2020.  Will repeat Pap next year.  Urine and bowel movements normal. Breasts normal. Mammo Neg 09/2021, scheduled for 09/2022.  Body mass index 25.53. Exercising regularly.  Health labs with family physician. Colonoscopy 2019.  Bone density normal in 2018.  Flu vaccine at pharmacy.  2. Post-menopausal  Postmenopause, well on no hormone replacement therapy.  No postmenopausal bleeding.  No pelvic pain.  No pain with intercourse. Bone density normal in 2018.    Princess Bruins MD, 1:49 PM

## 2022-10-06 ENCOUNTER — Ambulatory Visit
Admission: RE | Admit: 2022-10-06 | Discharge: 2022-10-06 | Disposition: A | Discharge status: Home / Self Care | Payer: Managed Care, Other (non HMO) | Source: Ambulatory Visit | Attending: Internal Medicine | Admitting: Internal Medicine

## 2022-10-06 DIAGNOSIS — Z1231 Encounter for screening mammogram for malignant neoplasm of breast: Secondary | ICD-10-CM

## 2023-09-05 ENCOUNTER — Other Ambulatory Visit (HOSPITAL_COMMUNITY)
Admission: RE | Admit: 2023-09-05 | Discharge: 2023-09-05 | Disposition: A | Discharge status: Home / Self Care | Payer: Managed Care, Other (non HMO) | Source: Ambulatory Visit | Attending: Radiology | Admitting: Radiology

## 2023-09-05 ENCOUNTER — Other Ambulatory Visit: Payer: Self-pay | Admitting: Radiology

## 2023-09-05 ENCOUNTER — Ambulatory Visit: Payer: 59 | Admitting: Radiology

## 2023-09-05 ENCOUNTER — Encounter: Payer: Self-pay | Admitting: Radiology

## 2023-09-05 VITALS — BP 120/70 | HR 81 | Ht 63.0 in | Wt 148.0 lb

## 2023-09-05 DIAGNOSIS — Z1231 Encounter for screening mammogram for malignant neoplasm of breast: Secondary | ICD-10-CM

## 2023-09-05 DIAGNOSIS — Z01419 Encounter for gynecological examination (general) (routine) without abnormal findings: Secondary | ICD-10-CM | POA: Insufficient documentation

## 2023-09-05 DIAGNOSIS — E2839 Other primary ovarian failure: Secondary | ICD-10-CM

## 2023-09-05 NOTE — Patient Instructions (Signed)
Preventive Care 40-62 Years Old, Female Preventive care refers to lifestyle choices and visits with your health care provider that can promote health and wellness. Preventive care visits are also called wellness exams. What can I expect for my preventive care visit? Counseling Your health care provider may ask you questions about your: Medical history, including: Past medical problems. Family medical history. Pregnancy history. Current health, including: Menstrual cycle. Method of birth control. Emotional well-being. Home life and relationship well-being. Sexual activity and sexual health. Lifestyle, including: Alcohol, nicotine or tobacco, and drug use. Access to firearms. Diet, exercise, and sleep habits. Work and work environment. Sunscreen use. Safety issues such as seatbelt and bike helmet use. Physical exam Your health care provider will check your: Height and weight. These may be used to calculate your BMI (body mass index). BMI is a measurement that tells if you are at a healthy weight. Waist circumference. This measures the distance around your waistline. This measurement also tells if you are at a healthy weight and may help predict your risk of certain diseases, such as type 2 diabetes and high blood pressure. Heart rate and blood pressure. Body temperature. Skin for abnormal spots. What immunizations do I need?  Vaccines are usually given at various ages, according to a schedule. Your health care provider will recommend vaccines for you based on your age, medical history, and lifestyle or other factors, such as travel or where you work. What tests do I need? Screening Your health care provider may recommend screening tests for certain conditions. This may include: Lipid and cholesterol levels. Diabetes screening. This is done by checking your blood sugar (glucose) after you have not eaten for a while (fasting). Pelvic exam and Pap test. Hepatitis B test. Hepatitis C  test. HIV (human immunodeficiency virus) test. STI (sexually transmitted infection) testing, if you are at risk. Lung cancer screening. Colorectal cancer screening. Mammogram. Talk with your health care provider about when you should start having regular mammograms. This may depend on whether you have a family history of breast cancer. BRCA-related cancer screening. This may be done if you have a family history of breast, ovarian, tubal, or peritoneal cancers. Bone density scan. This is done to screen for osteoporosis. Talk with your health care provider about your test results, treatment options, and if necessary, the need for more tests. Follow these instructions at home: Eating and drinking  Eat a diet that includes fresh fruits and vegetables, whole grains, lean protein, and low-fat dairy products. Take vitamin and mineral supplements as recommended by your health care provider. Do not drink alcohol if: Your health care provider tells you not to drink. You are pregnant, may be pregnant, or are planning to become pregnant. If you drink alcohol: Limit how much you have to 0-1 drink a day. Know how much alcohol is in your drink. In the [place] one drink equals one 12 oz bottle of beer (355 mL), one 5 oz glass of wine (148 mL), or one 1 oz glass of hard liquor (44 mL). Lifestyle Brush your teeth every morning and night with fluoride toothpaste. Floss one time each day. Exercise for at least 30 minutes 5 or more days each week. Do not use any products that contain nicotine or tobacco. These products include cigarettes, chewing tobacco, and vaping devices, such as e-cigarettes. If you need help quitting, ask your health care provider. Do not use drugs. If you are sexually active, practice safe sex. Use a condom or other form of protection to   prevent STIs. If you do not wish to become pregnant, use a form of birth control. If you plan to become pregnant, see your health care provider for a  prepregnancy visit. Take aspirin only as told by your health care provider. Make sure that you understand how much to take and what form to take. Work with your health care provider to find out whether it is safe and beneficial for you to take aspirin daily. Find healthy ways to manage stress, such as: Meditation, yoga, or listening to music. Journaling. Talking to a trusted person. Spending time with friends and family. Minimize exposure to UV radiation to reduce your risk of skin cancer. Safety Always wear your seat belt while driving or riding in a vehicle. Do not drive: If you have been drinking alcohol. Do not ride with someone who has been drinking. When you are tired or distracted. While texting. If you have been using any mind-altering substances or drugs. Wear a helmet and other protective equipment during sports activities. If you have firearms in your house, make sure you follow all gun safety procedures. Seek help if you have been physically or sexually abused. What's next? Visit your health care provider once a year for an annual wellness visit. Ask your health care provider how often you should have your eyes and teeth checked. Stay up to date on all vaccines. This information is not intended to replace advice given to you by your health care provider. Make sure you discuss any questions you have with your health care provider. Document Revised: 01/26/2021 Document Reviewed: 01/26/2021 [employer] Patient Education  2024 [employer]  

## 2023-09-05 NOTE — Progress Notes (Signed)
   Candice Lee [birth year] [id number]   History: Postmenopausal 63 y.o. presents for annual exam. No gyn concerns. Has a PCP. Typically exercises 3 times a week, increased family stress caring for aging father, has not been to the gym much lately.    Gynecologic History Postmenopausal Last Pap: 12/21. Results were: normal Last mammogram: 09/2022. Results were: normal Last colonoscopy: 2019 DEXA:2018   Obstetric History OB History  Gravida Para Term Preterm AB Living  0 0 0 0 0 0  SAB IAB Ectopic Multiple Live Births  0 0 0 0 0     The following portions of the patient's history were reviewed and updated as appropriate: allergies, current medications, past family history, past medical history, past social history, past surgical history, and problem list.  Review of Systems Pertinent items noted in HPI and remainder of comprehensive ROS otherwise negative.  Past medical history, past surgical history, family history and social history were all reviewed and documented in the EPIC chart.  Exam:  There were no vitals filed for this visit. There is no height or weight on file to calculate BMI.  General appearance:  Normal Thyroid:  Symmetrical, normal in size, without palpable masses or nodularity. Respiratory  Auscultation:  Clear without wheezing or rhonchi Cardiovascular  Auscultation:  Regular rate, without rubs, murmurs or gallops  Edema/varicosities:  Not grossly evident Abdominal  Soft,nontender, without masses, guarding or rebound.  Liver/spleen:  No organomegaly noted  Hernia:  None appreciated  Skin  Inspection:  Grossly normal Breasts: Examined lying and sitting.   Right: Without masses, retractions, nipple discharge or axillary adenopathy.   Left: Without masses, retractions, nipple discharge or axillary adenopathy. Genitourinary   Inguinal/mons:  Normal without inguinal adenopathy  External genitalia:  Normal appearing vulva with no masses, tenderness,  or lesions  BUS/Urethra/Skene's glands:  Normal  Vagina:  Normal appearing with normal color and discharge, no lesions. Atrophy: mild   Cervix:  Normal appearing without discharge or lesions  Uterus:  Normal in size, shape and contour.  Midline and mobile, nontender  Adnexa/parametria:     Rt: Normal in size, without masses or tenderness.   Lt: Normal in size, without masses or tenderness.  Anus and perineum: Normal    Raynelle Fanning, CMA present for exam  Assessment/Plan:   1. Well woman exam with routine gynecological exam (Primary) - Cytology - PAP( [facility])  2. Estrogen deficiency - DG Bone Density; Future   Discussed SBE, colonoscopy and DEXA screening as directed. Recommend of exercise weekly, including weight bearing exercise.  Return in 1 year for annual or sooner prn.  Arlie Solomons B WHNP-BC, 7:52 AM 09/05/2023

## 2023-09-11 ENCOUNTER — Encounter: Payer: Self-pay | Admitting: Nurse Practitioner

## 2023-09-11 LAB — CYTOLOGY - PAP
Comment: NEGATIVE
Diagnosis: REACTIVE
High risk HPV: NEGATIVE

## 2023-09-17 ENCOUNTER — Inpatient Hospital Stay (HOSPITAL_BASED_OUTPATIENT_CLINIC_OR_DEPARTMENT_OTHER): Admission: RE | Admit: 2023-09-17 | Payer: Managed Care, Other (non HMO) | Source: Ambulatory Visit

## 2023-09-24 ENCOUNTER — Ambulatory Visit (HOSPITAL_BASED_OUTPATIENT_CLINIC_OR_DEPARTMENT_OTHER)
Admission: RE | Admit: 2023-09-24 | Discharge: 2023-09-24 | Disposition: A | Discharge status: Home / Self Care | Payer: Managed Care, Other (non HMO) | Source: Ambulatory Visit | Attending: Radiology | Admitting: Radiology

## 2023-09-24 DIAGNOSIS — E2839 Other primary ovarian failure: Secondary | ICD-10-CM | POA: Diagnosis present

## 2023-09-26 ENCOUNTER — Encounter: Payer: Self-pay | Admitting: Nurse Practitioner

## 2023-10-08 ENCOUNTER — Ambulatory Visit
Admission: RE | Admit: 2023-10-08 | Discharge: 2023-10-08 | Disposition: A | Discharge status: Home / Self Care | Payer: Managed Care, Other (non HMO) | Source: Ambulatory Visit | Attending: Radiology | Admitting: Radiology

## 2023-10-08 DIAGNOSIS — Z1231 Encounter for screening mammogram for malignant neoplasm of breast: Secondary | ICD-10-CM

## 2023-10-11 ENCOUNTER — Other Ambulatory Visit: Payer: Self-pay | Admitting: Radiology

## 2023-10-11 DIAGNOSIS — R928 Other abnormal and inconclusive findings on diagnostic imaging of breast: Secondary | ICD-10-CM

## 2023-10-23 ENCOUNTER — Ambulatory Visit
Admission: RE | Admit: 2023-10-23 | Discharge: 2023-10-23 | Disposition: A | Discharge status: Home / Self Care | Payer: Managed Care, Other (non HMO) | Source: Ambulatory Visit | Attending: Radiology | Admitting: Radiology

## 2023-10-23 DIAGNOSIS — R928 Other abnormal and inconclusive findings on diagnostic imaging of breast: Secondary | ICD-10-CM

## 2024-05-08 ENCOUNTER — Encounter (HOSPITAL_BASED_OUTPATIENT_CLINIC_OR_DEPARTMENT_OTHER): Payer: Self-pay | Admitting: Ophthalmology

## 2024-05-08 ENCOUNTER — Other Ambulatory Visit: Payer: Self-pay

## 2024-05-15 ENCOUNTER — Ambulatory Visit: Payer: Self-pay | Admitting: Ophthalmology

## 2024-05-15 NOTE — H&P (Signed)
 Date of examination:  05-08-23  Indication for surgery: decompensated esotropia  Pertinent past medical history:  Past Medical History:  Diagnosis Date   Elevated cholesterol    History of infertility     Pertinent ocular history:  Patient with intermittent esotropia that broke down to constant tropia over the last few years. Cataract surgery performed within the last year to maximize vision and fusion; patient still highly symptomatic. 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 History of two ptosis surgeries.  Pertinent family history:  Family History  Problem Relation Age of Onset   Hypertension Mother    Breast cancer Mother        Age 6's   Hypertension Father    Heart disease Father    Hypertension Brother    Heart disease Brother     General:  Healthy appearing patient in no distress.    Eyes:    Acuity OD 20/40  OS 20/25   [place]  External: Within normal limits     Anterior segment: Within normal limits; IOLs OU  Motility:   20pd (R)E(T) in primary, with 18pd (R)E(T) in other gazes  Impression: 63yo female with decompensated intermittent esotropia that is poorly controlled  Plan: strabismic repair  EMERSON Ronnald Blanch, MD

## 2024-05-15 NOTE — H&P (View-Only) (Signed)
 Date of examination:  05-08-23  Indication for surgery: decompensated esotropia  Pertinent past medical history:  Past Medical History:  Diagnosis Date   Elevated cholesterol    History of infertility     Pertinent ocular history:  Patient with intermittent esotropia that broke down to constant tropia over the last few years. Cataract surgery performed within the last year to maximize vision and fusion; patient still highly symptomatic. 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 History of two ptosis surgeries.  Pertinent family history:  Family History  Problem Relation Age of Onset   Hypertension Mother    Breast cancer Mother        Age 6's   Hypertension Father    Heart disease Father    Hypertension Brother    Heart disease Brother     General:  Healthy appearing patient in no distress.    Eyes:    Acuity OD 20/40  OS 20/25   [place]  External: Within normal limits     Anterior segment: Within normal limits; IOLs OU  Motility:   20pd (R)E(T) in primary, with 18pd (R)E(T) in other gazes  Impression: 63yo female with decompensated intermittent esotropia that is poorly controlled  Plan: strabismic repair  EMERSON Ronnald Blanch, MD

## 2024-05-15 NOTE — Anesthesia Preprocedure Evaluation (Signed)
 Anesthesia Evaluation  Patient identified by MRN, date of birth, ID band Patient awake    Reviewed: Allergy & Precautions, NPO status , Patient's Chart, lab work & pertinent test results  History of Anesthesia Complications Negative for: history of anesthetic complications  Airway Mallampati: I  TM Distance: >3 FB Neck ROM: Full    Dental  (+) Dental Advisory Given   Pulmonary neg pulmonary ROS   Pulmonary exam normal        Cardiovascular negative cardio ROS Normal cardiovascular exam     Neuro/Psych negative neurological ROS  negative psych ROS   GI/Hepatic negative GI ROS, Neg liver ROS,,,  Endo/Other  negative endocrine ROS    Renal/GU negative Renal ROS     Musculoskeletal negative musculoskeletal ROS (+)    Abdominal   Peds  Hematology negative hematology ROS (+)   Anesthesia Other Findings   Reproductive/Obstetrics                              Anesthesia Physical Anesthesia Plan  ASA: 1  Anesthesia Plan: General   Post-op Pain Management: Tylenol PO (pre-op)*   Induction: Intravenous  PONV Risk Score and Plan: 3 and Treatment may vary due to age or medical condition, Ondansetron, Dexamethasone and Midazolam  Airway Management Planned: LMA  Additional Equipment: None  Intra-op Plan:   Post-operative Plan: Extubation in OR  Informed Consent: I have reviewed the patients History and Physical, chart, labs and discussed the procedure including the risks, benefits and alternatives for the proposed anesthesia with the patient or authorized representative who has indicated his/her understanding and acceptance.     Dental advisory given  Plan Discussed with: CRNA and Anesthesiologist  Anesthesia Plan Comments:          Anesthesia Quick Evaluation

## 2024-05-16 ENCOUNTER — Encounter (HOSPITAL_BASED_OUTPATIENT_CLINIC_OR_DEPARTMENT_OTHER)
Admission: RE | Disposition: A | Discharge status: Home / Self Care | Payer: Self-pay | Source: Home / Self Care | Attending: Ophthalmology

## 2024-05-16 ENCOUNTER — Ambulatory Visit (HOSPITAL_BASED_OUTPATIENT_CLINIC_OR_DEPARTMENT_OTHER): Payer: Self-pay | Admitting: Anesthesiology

## 2024-05-16 ENCOUNTER — Other Ambulatory Visit: Payer: Self-pay

## 2024-05-16 ENCOUNTER — Encounter (HOSPITAL_BASED_OUTPATIENT_CLINIC_OR_DEPARTMENT_OTHER): Payer: Self-pay | Admitting: Ophthalmology

## 2024-05-16 ENCOUNTER — Ambulatory Visit (HOSPITAL_BASED_OUTPATIENT_CLINIC_OR_DEPARTMENT_OTHER)
Admission: RE | Admit: 2024-05-16 | Discharge: 2024-05-16 | Disposition: A | Discharge status: Home / Self Care | Attending: Ophthalmology | Admitting: Ophthalmology

## 2024-05-16 DIAGNOSIS — Z961 Presence of intraocular lens: Secondary | ICD-10-CM | POA: Insufficient documentation

## 2024-05-16 DIAGNOSIS — Z9842 Cataract extraction status, left eye: Secondary | ICD-10-CM | POA: Diagnosis not present

## 2024-05-16 DIAGNOSIS — H5010 Unspecified exotropia: Secondary | ICD-10-CM | POA: Diagnosis not present

## 2024-05-16 DIAGNOSIS — H50311 Intermittent monocular esotropia, right eye: Secondary | ICD-10-CM | POA: Diagnosis present

## 2024-05-16 DIAGNOSIS — Z9841 Cataract extraction status, right eye: Secondary | ICD-10-CM | POA: Insufficient documentation

## 2024-05-16 DIAGNOSIS — H4423 Degenerative myopia, bilateral: Secondary | ICD-10-CM | POA: Diagnosis not present

## 2024-05-16 SURGERY — STRABISMUS SURGERY, BILATERAL
Anesthesia: General | Site: Eye | Laterality: Bilateral

## 2024-05-16 MED ORDER — DEXAMETHASONE SODIUM PHOSPHATE 4 MG/ML IJ SOLN
INTRAMUSCULAR | Status: DC | PRN
  Administered 2024-05-16: 10 mg via INTRAVENOUS

## 2024-05-16 MED ORDER — ACETAMINOPHEN 500 MG PO TABS
1000.0000 mg | ORAL_TABLET | Freq: Once | ORAL | Status: AC
  Administered 2024-05-16: 1000 mg via ORAL

## 2024-05-16 MED ORDER — MIDAZOLAM HCL 2 MG/2ML IJ SOLN
INTRAMUSCULAR | Status: AC
  Filled 2024-05-16: qty 2

## 2024-05-16 MED ORDER — DEXAMETHASONE SODIUM PHOSPHATE 10 MG/ML IJ SOLN
INTRAMUSCULAR | Status: AC
  Filled 2024-05-16: qty 1

## 2024-05-16 MED ORDER — BUPIVACAINE HCL (PF) 0.5 % IJ SOLN
INTRAMUSCULAR | Status: DC | PRN
  Administered 2024-05-16: 3 mL

## 2024-05-16 MED ORDER — OXYCODONE HCL 5 MG/5ML PO SOLN: 5.0000 mg | Freq: Once | ORAL | Status: DC | PRN

## 2024-05-16 MED ORDER — NEOMYCIN-POLYMYXIN-DEXAMETH 3.5-10000-0.1 OP OINT
TOPICAL_OINTMENT | OPHTHALMIC | Status: DC | PRN
  Administered 2024-05-16: 1 via OPHTHALMIC

## 2024-05-16 MED ORDER — FENTANYL CITRATE (PF) 100 MCG/2ML IJ SOLN
INTRAMUSCULAR | Status: DC | PRN
  Administered 2024-05-16: 50 ug via INTRAVENOUS

## 2024-05-16 MED ORDER — OXYCODONE HCL 5 MG PO TABS: 5.0000 mg | ORAL_TABLET | Freq: Once | ORAL | Status: DC | PRN

## 2024-05-16 MED ORDER — LIDOCAINE 2% (20 MG/ML) 5 ML SYRINGE
INTRAMUSCULAR | Status: DC | PRN
  Administered 2024-05-16: 4 mg via INTRAVENOUS

## 2024-05-16 MED ORDER — PROPOFOL 10 MG/ML IV BOLUS
INTRAVENOUS | Status: DC | PRN
Start: 2024-05-16 — End: 2024-05-16
  Administered 2024-05-16: 150 mg via INTRAVENOUS

## 2024-05-16 MED ORDER — LIDOCAINE HCL (CARDIAC) PF 100 MG/5ML IV SOSY
PREFILLED_SYRINGE | INTRAVENOUS | Status: DC | PRN
  Administered 2024-05-16: 60 mg via INTRAVENOUS

## 2024-05-16 MED ORDER — TOBRAMYCIN-DEXAMETHASONE 0.3-0.1 % OP SUSP: 1.0000 [drp] | Freq: Four times a day (QID) | OPHTHALMIC | Status: AC

## 2024-05-16 MED ORDER — AMISULPRIDE (ANTIEMETIC) 5 MG/2ML IV SOLN: 10.0000 mg | Freq: Once | INTRAVENOUS | Status: DC | PRN

## 2024-05-16 MED ORDER — ACETAMINOPHEN 500 MG PO TABS
ORAL_TABLET | ORAL | Status: AC
  Filled 2024-05-16: qty 2

## 2024-05-16 MED ORDER — MIDAZOLAM HCL 5 MG/5ML IJ SOLN
INTRAMUSCULAR | Status: DC | PRN
  Administered 2024-05-16: 2 mg via INTRAVENOUS

## 2024-05-16 MED ORDER — KETOROLAC TROMETHAMINE 30 MG/ML IJ SOLN
INTRAMUSCULAR | Status: DC | PRN
Start: 2024-05-16 — End: 2024-05-16
  Administered 2024-05-16: 30 mg via INTRAVENOUS

## 2024-05-16 MED ORDER — PHENYLEPHRINE HCL 2.5 % OP SOLN
OPHTHALMIC | Status: AC
  Filled 2024-05-16: qty 2

## 2024-05-16 MED ORDER — FENTANYL CITRATE (PF) 100 MCG/2ML IJ SOLN: 25.0000 ug | INTRAMUSCULAR | Status: DC | PRN

## 2024-05-16 MED ORDER — LACTATED RINGERS IV SOLN: INTRAVENOUS | Status: DC

## 2024-05-16 MED ORDER — PHENYLEPHRINE HCL 2.5 % OP SOLN
1.0000 [drp] | Freq: Once | OPHTHALMIC | Status: AC
  Administered 2024-05-16: 1 [drp] via OPHTHALMIC

## 2024-05-16 MED ORDER — LIDOCAINE 2% (20 MG/ML) 5 ML SYRINGE
INTRAMUSCULAR | Status: AC
  Filled 2024-05-16: qty 5

## 2024-05-16 MED ORDER — GLYCOPYRROLATE 0.2 MG/ML IJ SOLN
INTRAMUSCULAR | Status: DC | PRN
  Administered 2024-05-16: .2 mg via INTRAVENOUS

## 2024-05-16 MED ORDER — FENTANYL CITRATE (PF) 100 MCG/2ML IJ SOLN
INTRAMUSCULAR | Status: AC
  Filled 2024-05-16: qty 2

## 2024-05-16 MED ORDER — KETOROLAC TROMETHAMINE 30 MG/ML IJ SOLN
INTRAMUSCULAR | Status: AC
  Filled 2024-05-16: qty 1

## 2024-05-16 MED ORDER — SODIUM CHLORIDE 0.9 % IV SOLN: 12.5000 mg | INTRAVENOUS | Status: DC | PRN

## 2024-05-16 MED ORDER — BSS IO SOLN
INTRAOCULAR | Status: DC | PRN
  Administered 2024-05-16: 15 mL via INTRAOCULAR

## 2024-05-16 MED ORDER — PHENYLEPHRINE HCL 10 % OP SOLN: 1.0000 [drp] | Freq: Once | OPHTHALMIC | Status: DC

## 2024-05-16 MED ORDER — ONDANSETRON HCL 4 MG/2ML IJ SOLN
INTRAMUSCULAR | Status: AC
  Filled 2024-05-16: qty 2

## 2024-05-16 SURGICAL SUPPLY — 24 items
APPLICATOR COTTON TIP 6 STRL (MISCELLANEOUS) ×4 IMPLANT
APPLICATOR DR MATTHEWS STRL (MISCELLANEOUS) ×1 IMPLANT
BNDG EYE OVAL 2 1/8 X 2 5/8 (GAUZE/BANDAGES/DRESSINGS) IMPLANT
CAUTERY EYE LOW TEMP 1300F FIN (OPHTHALMIC RELATED) IMPLANT
CORD BIPOLAR FORCEPS 12FT (ELECTRODE) ×1 IMPLANT
COVER BACK TABLE 60X90IN (DRAPES) ×1 IMPLANT
COVER MAYO STAND STRL (DRAPES) ×1 IMPLANT
DRAPE SURG 17X23 STRL (DRAPES) IMPLANT
DRAPE U-SHAPE 76X120 STRL (DRAPES) ×1 IMPLANT
GLOVE BIO SURGEON STRL SZ 6.5 (GLOVE) ×1 IMPLANT
GLOVE BIO SURGEON STRL SZ7 (GLOVE) ×1 IMPLANT
GOWN STRL REUS W/ TWL LRG LVL3 (GOWN DISPOSABLE) ×2 IMPLANT
NS IRRIG 1000ML POUR BTL (IV SOLUTION) ×1 IMPLANT
PACK BASIN DAY SURGERY FS (CUSTOM PROCEDURE TRAY) ×1 IMPLANT
SHIELD EYE MED CORNL SHD 22X21 (OPHTHALMIC RELATED) IMPLANT
SPEAR EYE SURG WECK-CEL (MISCELLANEOUS) ×1 IMPLANT
STRIP CLOSURE SKIN 1/4X4 (GAUZE/BANDAGES/DRESSINGS) IMPLANT
SUT CHROMIC 7 0 TG140 8 (SUTURE) ×1 IMPLANT
SUT VICRYL 6 0 S 28 (SUTURE) IMPLANT
SUT VICRYL ABS 6-0 S29 18IN (SUTURE) IMPLANT
SYR 10ML LL (SYRINGE) ×1 IMPLANT
SYR 3ML 23GX1 SAFETY (SYRINGE) ×2 IMPLANT
TOWEL GREEN STERILE FF (TOWEL DISPOSABLE) ×1 IMPLANT
TRAY DSU PREP LF (CUSTOM PROCEDURE TRAY) ×1 IMPLANT

## 2024-05-16 NOTE — Discharge Instructions (Addendum)
 Diet: Clear liquids, advance to soft foods then regular diet as tolerated.  Pain control: Overlapping ibuprofen (aka Advil, Motrin) with acetaminophen (aka Tylenol, Excedrin) has been clinically proven as effective for pain as morphine.  1)  Ibuprofen 600 mg by mouth every 6 hours as needed for pain   Do not take this medication if you already take NSAIDs (such as naproxen/Aleve or Surveyor, quantity).  2)  Acetaminophen 325 one or two by mouth every 4-6 hours as needed for pain that is not resolved by ibuprofen   Do not take this medication if you already take acetaminophen within another medication (such as Percocet/Lortab).  Eye medications:  Antibiotic eye drops or ointment, one drop or application in the operated eye(s) 4 times a day for 7 days.    Activity: No swimming for 1 week. It is OK to let water run over the face and eyes while showering or taking a bath, even during the first week.  No other restriction on exercise or activity.  Eye movement: The eyes may look very slightly crossed in or turned out, and you may experience temporary double vision (diplopia). This is not unusual postoperatively and may happen up to two months after surgery while the muscles are healing. The eyes may be tired during the first few weeks after surgery; reading can be uncomfortable during the healing process but will not hurt the eyes.  Call Dr. Anthony office [phone] with any problems or concerns.  No Tylenol before 1:45pm today No NSAIDS (Ibuprofen/Motrin/Aleve) before 5:45pm today.   Post Anesthesia Home Care Instructions  Activity: Get plenty of rest for the remainder of the day. A responsible individual must stay with you for 24 hours following the procedure.  For the next 24 hours, DO NOT: -Drive a car -Advertising copywriter -Drink alcoholic beverages -Take any medication unless instructed by your physician -Make any legal decisions or sign important papers.  Meals: Start with liquid  foods such as gelatin or soup. Progress to regular foods as tolerated. Avoid greasy, spicy, heavy foods. If nausea and/or vomiting occur, drink only clear liquids until the nausea and/or vomiting subsides. Call your physician if vomiting continues.  Special Instructions/Symptoms: Your throat may feel dry or sore from the anesthesia or the breathing tube placed in your throat during surgery. If this causes discomfort, gargle with warm salt water. The discomfort should disappear within 24 hours.  If you had a scopolamine patch placed behind your ear for the management of post- operative nausea and/or vomiting:  1. The medication in the patch is effective for 72 hours, after which it should be removed.  Wrap patch in a tissue and discard in the trash. Wash hands thoroughly with soap and water. 2. You may remove the patch earlier than 72 hours if you experience unpleasant side effects which may include dry mouth, dizziness or visual disturbances. 3. Avoid touching the patch. Wash your hands with soap and water after contact with the patch.

## 2024-05-16 NOTE — Anesthesia Postprocedure Evaluation (Signed)
 Anesthesia Post Note  Patient: Candice Lee  Procedure(s) Performed: STRABISMUS SURGERY, BILATERAL (Bilateral: Eye)     Patient location during evaluation: PACU Anesthesia Type: General Level of consciousness: awake and alert Pain management: pain level controlled Vital Signs Assessment: post-procedure vital signs reviewed and stable Respiratory status: spontaneous breathing, nonlabored ventilation and respiratory function stable Cardiovascular status: stable and blood pressure returned to baseline Anesthetic complications: no   No notable events documented.  Last Vitals:  Vitals:   05/16/24 1030 05/16/24 1044  BP: 114/74 128/69  Pulse: 71 70  Resp: 16 16  Temp:  (!) 36.3 C  SpO2: 100% 99%    Last Pain:  Vitals:   05/16/24 1044  TempSrc: Temporal  PainSc:                  Debby FORBES Like

## 2024-05-16 NOTE — Interval H&P Note (Signed)
 History and Physical Interval Note:  05/16/2024 8:40 AM  Heron Coats  has presented today for surgery, with the diagnosis of intermittent monocular esotropia of right eye.  The various methods of treatment have been discussed with the patient and family. After consideration of risks, benefits and other options for treatment, the patient has consented to  Procedure(s): STRABISMUS SURGERY, BILATERAL (Bilateral) as a surgical intervention.  The patient's history has been reviewed, patient examined, no change in status, stable for surgery.  I have reviewed the patient's chart and labs.  Questions were answered to the patient's satisfaction.     [place] Blanch

## 2024-05-16 NOTE — Op Note (Signed)
 05/16/2024  9:55 AM  PATIENT:  Candice Lee  63 y.o. female  PRE-OPERATIVE DIAGNOSIS:   1. Esotropia  2. History of high myopia  3. History of cataract extraction with intraocular lens implants OU  POST-OPERATIVE DIAGNOSIS:  same  PROCEDURE:  Medial rectus muscle recession  3.29mm  both  SURGEON:  [place] Ronnald Blanch, M.D.   ANESTHESIA:  General LMA and subTenons Marcaine  COMPLICATIONS: None immediate  DESCRIPTION OF PROCEDURE: The patient was taken to the operating room where She was identified by me. General anesthesia was induced without difficulty after placement of appropriate monitors. The patient was prepped and draped in standard sterile fashion. Maxitrol eye ointment was placed on both eyes for corneal protection during the case. Notably, under anesthesia there was approximately 30pd of esotropia by Herschberg. A lid speculum was placed in the right eye.  Through an inferonasal fornix incision through conjunctiva and Tenon's fascia, the right medial rectus muscle was engaged on a series of muscle hooks and cleared of its fascial attachments. The tendon was secured with a double-armed 6-0 Vicryl suture with a double locking bite at each border of the muscle, 1 mm from the insertion. The muscle was disinserted, and was reattached to sclera at a measured distance of 3.5 millimeters posterior to the original insertion, using direct scleral passes in crossed swords fashion.  The suture ends were tied securely after the position of the muscle had been checked and found to be accurate. Approximately 1.50mL of 0.75% bupivacaine was injected peribulbar for postoperative anesthesia. Conjunctiva was closed with 2 6-0 Vicryl sutures.  The speculum was transferred to the left eye, where an identical procedure was performed, again effecting a 3.5 millimeters recession of the medial rectus muscle.  Two drops of dilute betadine were placed on each eye and rinsed after ten seconds; Maxitrol  ointment was placed in each eye. The patient was awakened without difficulty and taken to the recovery room in stable condition, having suffered no intraoperative or immediate postoperative complications.  EMERSON Ronnald Blanch, M.D.

## 2024-05-16 NOTE — Anesthesia Procedure Notes (Signed)
 Procedure Name: LMA Insertion Date/Time: 05/16/2024 9:13 AM  Performed by: Julieanne Fairy BROCKS, CRNAPre-anesthesia Checklist: Patient identified, Emergency Drugs available, Suction available and Patient being monitored Patient Re-evaluated:Patient Re-evaluated prior to induction Oxygen Delivery Method: Circle system utilized Preoxygenation: Pre-oxygenation with 100% oxygen Induction Type: IV induction Ventilation: Mask ventilation without difficulty LMA: LMA inserted LMA Size: 4.0 Number of attempts: 1 Airway Equipment and Method: Bite block Placement Confirmation: positive ETCO2 Tube secured with: Tape Dental Injury: Teeth and Oropharynx as per pre-operative assessment

## 2024-05-16 NOTE — Transfer of Care (Signed)
 Immediate Anesthesia Transfer of Care Note  Patient: Candice Lee  Procedure(s) Performed: STRABISMUS SURGERY, BILATERAL (Bilateral: Eye)  Patient Location: PACU  Anesthesia Type:General  Level of Consciousness: sedated  Airway & Oxygen Therapy: Patient Spontanous Breathing and Patient connected to face mask oxygen  Post-op Assessment: Report given to RN and Post -op Vital signs reviewed and stable  Post vital signs: Reviewed and stable  Last Vitals:  Vitals Value Taken Time  BP 96/55 05/16/24 10:00  Temp 36.2 C 05/16/24 10:00  Pulse 77 05/16/24 10:02  Resp 12 05/16/24 10:02  SpO2 98 % 05/16/24 10:02  Vitals shown include unfiled device data.  Last Pain:  Vitals:   05/16/24 0746  TempSrc: Temporal  PainSc: 0-No pain         Complications: No notable events documented.

## 2024-05-17 ENCOUNTER — Encounter (HOSPITAL_BASED_OUTPATIENT_CLINIC_OR_DEPARTMENT_OTHER): Payer: Self-pay | Admitting: Ophthalmology

## 2024-09-05 ENCOUNTER — Ambulatory Visit: Payer: 59 | Admitting: Radiology

## 2024-09-17 ENCOUNTER — Encounter: Payer: Self-pay | Admitting: Radiology

## 2024-09-17 ENCOUNTER — Ambulatory Visit (INDEPENDENT_AMBULATORY_CARE_PROVIDER_SITE_OTHER): Admitting: Radiology

## 2024-09-17 VITALS — BP 124/72 | HR 77 | Ht 62.75 in | Wt 139.0 lb

## 2024-09-17 DIAGNOSIS — Z1331 Encounter for screening for depression: Secondary | ICD-10-CM

## 2024-09-17 DIAGNOSIS — Z01419 Encounter for gynecological examination (general) (routine) without abnormal findings: Secondary | ICD-10-CM | POA: Diagnosis not present

## 2024-09-17 NOTE — Progress Notes (Signed)
" ° °  Lismary Kiehn 1960/10/13 [id number]   History: Postmenopausal 64 y.o. presents for annual exam. Doing well, no gyn concerns.   Gynecologic History Postmenopausal Last Pap: 09/05/23. Results were: normal Last mammogram: 10/23/23. Results were: normal Last colonoscopy: 02/21/23 DEXA:09/24/23 normal   Obstetric History OB History  Gravida Para Term Preterm AB Living  0 0 0 0 0 0  SAB IAB Ectopic Multiple Live Births  0 0 0 0 0       09/17/2024   11:30 AM 09/05/2023    7:53 AM  Depression screen PHQ 2/9  Decreased Interest 0 0  Down, Depressed, Hopeless 0 0  PHQ - 2 Score 0 0     The following portions of the patient's history were reviewed and updated as appropriate: allergies, current medications, past family history, past medical history, past social history, past surgical history, and problem list.  Review of Systems Pertinent items noted in HPI and remainder of comprehensive ROS otherwise negative.  Past medical history, past surgical history, family history and social history were all reviewed and documented in the EPIC chart.  Exam:  Vitals:   09/17/24 1129  BP: 124/72  Pulse: 77  SpO2: 99%  Weight: 139 lb (63 kg)  Height: 5' 2.75 (1.594 m)   Body mass index is 24.82 kg/m.  General appearance:  Normal Thyroid:  Symmetrical, normal in size, without palpable masses or nodularity. Respiratory  Auscultation:  Clear without wheezing or rhonchi Cardiovascular  Auscultation:  Regular rate, without rubs, murmurs or gallops  Edema/varicosities:  Not grossly evident Abdominal  Soft,nontender, without masses, guarding or rebound.  Liver/spleen:  No organomegaly noted  Hernia:  None appreciated  Skin  Inspection:  Grossly normal Breasts: Examined lying and sitting.   Right: Without masses, retractions, nipple discharge or axillary adenopathy.   Left: Without masses, retractions, nipple discharge or axillary adenopathy. Genitourinary   Inguinal/mons:   Normal without inguinal adenopathy  External genitalia:  Normal appearing vulva with no masses, tenderness, or lesions  BUS/Urethra/Skene's glands:  Normal  Vagina:  Normal appearing with normal color and discharge, no lesions. Atrophy: mild   Cervix:  Normal appearing without discharge or lesions  Uterus:  Normal in size, shape and contour.  Midline and mobile, nontender  Adnexa/parametria:     Rt: Normal in size, without masses or tenderness.   Lt: Normal in size, without masses or tenderness.  Anus and perineum: Normal    Darice Hoit, CMA present for exam  Assessment/Plan:   1. Well woman exam with routine gynecological exam (Primary) Pap 2028 Mammo yearly Labs with PCP  2. Depression screening negative    Return in 1 year for annual or sooner prn.  Karys Meckley B WHNP-BC, 11:45 AM 09/17/2024 "

## 2025-09-18 ENCOUNTER — Ambulatory Visit: Admitting: Radiology
